# Patient Record
Sex: Male | Born: 1955 | Race: White | Hispanic: No | Marital: Married | Smoking: Former smoker
Health system: Southern US, Community
[De-identification: ages and names within clinical notes are randomized; demographics above are authoritative.]

## PROBLEM LIST (undated history)

## (undated) DIAGNOSIS — F32A Depression, unspecified: Secondary | ICD-10-CM

## (undated) DIAGNOSIS — K219 Gastro-esophageal reflux disease without esophagitis: Secondary | ICD-10-CM

## (undated) DIAGNOSIS — K921 Melena: Secondary | ICD-10-CM

## (undated) DIAGNOSIS — R42 Dizziness and giddiness: Secondary | ICD-10-CM

## (undated) DIAGNOSIS — F329 Major depressive disorder, single episode, unspecified: Secondary | ICD-10-CM

## (undated) DIAGNOSIS — Z87442 Personal history of urinary calculi: Secondary | ICD-10-CM

## (undated) DIAGNOSIS — I8390 Asymptomatic varicose veins of unspecified lower extremity: Secondary | ICD-10-CM

## (undated) DIAGNOSIS — B019 Varicella without complication: Secondary | ICD-10-CM

## (undated) DIAGNOSIS — C61 Malignant neoplasm of prostate: Secondary | ICD-10-CM

## (undated) HISTORY — DX: Major depressive disorder, single episode, unspecified: F32.9

## (undated) HISTORY — PX: PROSTATE SURGERY: SHX751

## (undated) HISTORY — DX: Varicella without complication: B01.9

## (undated) HISTORY — PX: OTHER SURGICAL HISTORY: SHX169

## (undated) HISTORY — DX: Gastro-esophageal reflux disease without esophagitis: K21.9

## (undated) HISTORY — DX: Dizziness and giddiness: R42

## (undated) HISTORY — DX: Asymptomatic varicose veins of unspecified lower extremity: I83.90

## (undated) HISTORY — DX: Melena: K92.1

## (undated) HISTORY — PX: VARICOSE VEIN SURGERY: SHX832

## (undated) HISTORY — DX: Depression, unspecified: F32.A

## (undated) HISTORY — DX: Malignant neoplasm of prostate: C61

## (undated) HISTORY — PX: MULTIPLE TOOTH EXTRACTIONS: SHX2053

---

## 2003-11-28 ENCOUNTER — Ambulatory Visit: Payer: Self-pay | Admitting: Surgery

## 2003-11-29 ENCOUNTER — Ambulatory Visit: Payer: Self-pay | Admitting: Surgery

## 2005-07-15 ENCOUNTER — Emergency Department: Payer: Self-pay | Admitting: Emergency Medicine

## 2006-06-10 ENCOUNTER — Ambulatory Visit: Payer: Self-pay | Admitting: Internal Medicine

## 2006-11-30 ENCOUNTER — Ambulatory Visit: Payer: Self-pay | Admitting: Unknown Physician Specialty

## 2012-09-27 ENCOUNTER — Ambulatory Visit: Payer: Self-pay | Admitting: Internal Medicine

## 2013-03-18 ENCOUNTER — Ambulatory Visit: Payer: Self-pay | Admitting: Internal Medicine

## 2013-05-01 ENCOUNTER — Emergency Department: Payer: Self-pay | Admitting: Emergency Medicine

## 2015-02-01 ENCOUNTER — Ambulatory Visit: Payer: PRIVATE HEALTH INSURANCE | Admitting: Family Medicine

## 2015-02-07 ENCOUNTER — Ambulatory Visit: Payer: PRIVATE HEALTH INSURANCE | Admitting: Family Medicine

## 2015-02-09 ENCOUNTER — Encounter (INDEPENDENT_AMBULATORY_CARE_PROVIDER_SITE_OTHER): Payer: Self-pay

## 2015-02-09 ENCOUNTER — Ambulatory Visit (INDEPENDENT_AMBULATORY_CARE_PROVIDER_SITE_OTHER): Payer: PRIVATE HEALTH INSURANCE | Admitting: Family Medicine

## 2015-02-09 ENCOUNTER — Encounter: Payer: Self-pay | Admitting: Family Medicine

## 2015-02-09 VITALS — BP 116/70 | HR 59 | Temp 98.0°F | Ht 66.0 in | Wt 196.4 lb

## 2015-02-09 DIAGNOSIS — R079 Chest pain, unspecified: Secondary | ICD-10-CM | POA: Diagnosis not present

## 2015-02-09 DIAGNOSIS — R42 Dizziness and giddiness: Secondary | ICD-10-CM

## 2015-02-09 NOTE — Patient Instructions (Signed)
Nice to meet you. We will have you return for lab work. If you develop chest pain, shortness of breath, persistent dizziness, numbness, weakness, vision changes, palpitations, or any new or changing symptoms please seek medical attention.

## 2015-02-13 ENCOUNTER — Encounter: Payer: Self-pay | Admitting: Family Medicine

## 2015-02-13 DIAGNOSIS — R079 Chest pain, unspecified: Secondary | ICD-10-CM | POA: Insufficient documentation

## 2015-02-13 DIAGNOSIS — R42 Dizziness and giddiness: Secondary | ICD-10-CM | POA: Insufficient documentation

## 2015-02-13 NOTE — Assessment & Plan Note (Signed)
Chest pain atypical in nature. Reassuring EKG. Could be musculoskeletal. Could be cardiac. Doubt pulmonary cause given normal lung exam and stable oxygenation. Doubt VTE given stable vitals. Discussed cardiology evaluation given his family history, though he declined this. We will obtain screening lab work. He would like to continue to monitor this at this time. Given return precautions.

## 2015-02-13 NOTE — Progress Notes (Signed)
Patient ID: Brian Luna, male   DOB: 02-24-1955, 60 y.o.   MRN: [id number]  Brian Rumps, MD Phone: [phone]  Brian Luna is a 60 y.o. male who presents today for new patient visit.  Vertigo: Patient notes long history of vertigo. States he intermittently has a sensation of feeling off balance. Denies lightheadedness, chest pain, and palpitations with this. Typically lasts a couple days at a time. Has previously been evaluated by ENT for this and advised that it was vertigo. He takes Antivert and this helps with his symptoms. Denies numbness, weakness, and vision changes. No headaches.  Chest pain: Patient notes 2 weekends ago he had a light discomfort in the center of his chest. 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 No shortness of breath, diaphoresis, or radiation. No lower extremity edema. No fevers. States it lasted for about 24 hours. Resolved on its own. Last time he had symptoms like this was several years ago. No personal history of cardiac issues. Father had a heart attack at 20. Grandfather had a heart attack at 46. No recurrence of symptoms since that time. No history of DVT. No history of PE. No recent surgeries. No recent trips. Reports he previously saw a cardiologist for chest pain. Reports having had a stress test in the past.  Active Ambulatory Problems    Diagnosis Date Noted  . Chest pain 02/13/2015  . Vertigo 02/13/2015   Resolved Ambulatory Problems    Diagnosis Date Noted  . No Resolved Ambulatory Problems   Past Medical History  Diagnosis Date  . Blood in stool   . Prostate cancer ([place])   . Chickenpox   . Depression   . GERD (gastroesophageal reflux disease)   . Varicose veins     Family History  Problem Relation Age of Onset  . Arthritis    . Heart attack Father 42  . Heart attack Paternal Grandfather 62    Social History   Social History  . Marital Status: Married    Spouse Name: N/A  . Number of Children: N/A  .  Years of Education: N/A   Occupational History  . Not on file.   Social History Main Topics  . Smoking status: Former Research scientist (life sciences)  . Smokeless tobacco: Not on file  . Alcohol Use: 0.0 oz/week    0 Standard drinks or equivalent per week     Comment: Occasional  . Drug Use: No  . Sexual Activity: Not on file   Other Topics Concern  . Not on file   Social History Narrative    ROS   General:  Negative for nexplained weight loss, fever Skin: Negative for new or changing mole, sore that won't heal HEENT: Positive for trouble hearing, ringing in his ears, Negative for trouble seeing, mouth sores, hoarseness, change in voice, dysphagia. CV:  Positive for chest pain, Negative for dyspnea, edema, palpitations Resp: Negative for cough, dyspnea, hemoptysis GI: Negative for nausea, vomiting, diarrhea, constipation, abdominal pain, melena, hematochezia. GU: Positive for urinary incontinence and frequent urination and sexual difficulty (all following prostate surgery, unchanged recently), Negative for dysuria, urinary hesitance, hematuria, vaginal or penile discharge, polyuria, lumps in testicle or breasts MSK: Negative for muscle cramps or aches, joint pain or swelling Neuro: Positive for dizziness, Negative for headaches, weakness, numbness, passing out/fainting Psych: Negative for depression, anxiety, memory problems  Objective  Physical Exam Filed Vitals:   02/09/15 1507  BP: 116/70  Pulse: 59  Temp: 98 F (36.7 C)  BP Readings from Last 3 Encounters:  02/09/15 116/70   Wt Readings from Last 3 Encounters:  02/09/15 196 lb 6.4 oz (89.086 kg)    Physical Exam  Constitutional: He is well-developed, well-nourished, and in no distress.  HENT:  Head: Normocephalic and atraumatic.  Right Ear: External ear normal.  Left Ear: External ear normal.  Mouth/Throat: Oropharynx is clear and moist. No oropharyngeal exudate.  Eyes: Conjunctivae are normal. Pupils are equal, round, and  reactive to light.  Neck: Neck supple.  Cardiovascular: Normal rate, regular rhythm and normal heart sounds.  Exam reveals no gallop and no friction rub.   No murmur heard. Pulmonary/Chest: Effort normal and breath sounds normal. No respiratory distress. He has no wheezes. He has no rales.  Abdominal: Soft. Bowel sounds are normal. He exhibits no distension. There is no tenderness. There is no rebound and no guarding.  Musculoskeletal: He exhibits no edema.  Lymphadenopathy:    He has no cervical adenopathy.  Neurological: He is alert.  CN 2-12 intact, 5/5 strength in bilateral biceps, triceps, grip, quads, hamstrings, plantar and dorsiflexion, sensation to light touch intact in bilateral UE and LE, normal gait, 2+ patellar reflexes  Skin: Skin is warm and dry. He is not diaphoretic.  Psychiatric: Mood and affect normal.   EKG: Sinus bradycardia, rate 55, early repolarization variant and lateral leads, no ischemic changes  Assessment/Plan:   Chest pain Chest pain atypical in nature. Reassuring EKG. Could be musculoskeletal. Could be cardiac. Doubt pulmonary cause given normal lung exam and stable oxygenation. Doubt VTE given stable vitals. Discussed cardiology evaluation given his family history, though he declined this. We will obtain screening lab work. He would like to continue to monitor this at this time. Given return precautions.  Vertigo Patient presents with intermittent episodes of vertigo. Neurologically intact. Previously evaluated by ENT. Well treated with as needed meclizine. We will request records from ENT. She will continue to monitor. Given return precautions.    Orders Placed This Encounter  Procedures  . Lipid Profile    Standing Status: Future     Number of Occurrences:      Standing Expiration Date: 02/09/2016  . Comp Met (CMET)    Standing Status: Future     Number of Occurrences:      Standing Expiration Date: 02/09/2016  . HgB A1c    Standing Status: Future      Number of Occurrences:      Standing Expiration Date: 02/09/2016  . TSH    Standing Status: Future     Number of Occurrences:      Standing Expiration Date: 02/09/2016  . CBC    Standing Status: Future     Number of Occurrences:      Standing Expiration Date: 02/09/2016  . EKG 12-Lead    Brian Luna

## 2015-02-13 NOTE — Assessment & Plan Note (Signed)
Patient presents with intermittent episodes of vertigo. Neurologically intact. Previously evaluated by ENT. Well treated with as needed meclizine. We will request records from ENT. She will continue to monitor. Given return precautions.

## 2015-03-09 ENCOUNTER — Other Ambulatory Visit (INDEPENDENT_AMBULATORY_CARE_PROVIDER_SITE_OTHER): Payer: PRIVATE HEALTH INSURANCE

## 2015-03-09 DIAGNOSIS — R079 Chest pain, unspecified: Secondary | ICD-10-CM | POA: Diagnosis not present

## 2015-03-09 LAB — LIPID PANEL
CHOLESTEROL: 188 mg/dL (ref 0–200)
HDL: 48.9 mg/dL (ref >39.00)
LDL Cholesterol: 124 mg/dL — ABNORMAL HIGH (ref 0–99)
NonHDL: 138.83
TRIGLYCERIDES: 74 mg/dL (ref 0.0–149.0)
Total CHOL/HDL Ratio: 4
VLDL: 14.8 mg/dL (ref 0.0–40.0)

## 2015-03-09 LAB — COMPREHENSIVE METABOLIC PANEL
ALBUMIN: 4.5 g/dL (ref 3.5–5.2)
ALK PHOS: 66 U/L (ref 39–117)
ALT: 20 U/L (ref 0–53)
AST: 22 U/L (ref 0–37)
BILIRUBIN TOTAL: 0.8 mg/dL (ref 0.2–1.2)
BUN: 15 mg/dL (ref 6–23)
CALCIUM: 9.6 mg/dL (ref 8.4–10.5)
CO2: 27 mEq/L (ref 19–32)
Chloride: 107 mEq/L (ref 96–112)
Creatinine, Ser: 1.02 mg/dL (ref 0.40–1.50)
GFR: 79.57 mL/min (ref >60.00)
GLUCOSE: 93 mg/dL (ref 70–99)
Potassium: 4.8 mEq/L (ref 3.5–5.1)
Sodium: 142 mEq/L (ref 135–145)
TOTAL PROTEIN: 6.8 g/dL (ref 6.0–8.3)

## 2015-03-09 LAB — CBC
HEMATOCRIT: 43.1 % (ref 39.0–52.0)
Hemoglobin: 14.7 g/dL (ref 13.0–17.0)
MCHC: 34.1 g/dL (ref 30.0–36.0)
MCV: 91 fl (ref 78.0–100.0)
Platelets: 225 10*3/uL (ref 150.0–400.0)
RBC: 4.73 Mil/uL (ref 4.22–5.81)
RDW: 14.2 % (ref 11.5–15.5)
WBC: 8 10*3/uL (ref 4.0–10.5)

## 2015-03-09 LAB — HEMOGLOBIN A1C: HEMOGLOBIN A1C: 5.1 % (ref 4.6–6.5)

## 2015-03-09 LAB — TSH: TSH: 0.72 u[IU]/mL (ref 0.35–4.50)

## 2015-03-19 ENCOUNTER — Ambulatory Visit (INDEPENDENT_AMBULATORY_CARE_PROVIDER_SITE_OTHER): Payer: PRIVATE HEALTH INSURANCE | Admitting: Family Medicine

## 2015-03-19 ENCOUNTER — Encounter: Payer: Self-pay | Admitting: Family Medicine

## 2015-03-19 VITALS — BP 124/80 | HR 65 | Temp 98.1°F | Ht 66.0 in | Wt 198.2 lb

## 2015-03-19 DIAGNOSIS — R42 Dizziness and giddiness: Secondary | ICD-10-CM

## 2015-03-19 DIAGNOSIS — E7800 Pure hypercholesterolemia, unspecified: Secondary | ICD-10-CM | POA: Insufficient documentation

## 2015-03-19 DIAGNOSIS — R079 Chest pain, unspecified: Secondary | ICD-10-CM

## 2015-03-19 NOTE — Assessment & Plan Note (Addendum)
No episodes since his last visit. Neurologically intact. We'll continue to monitor. Given return precautions.

## 2015-03-19 NOTE — Assessment & Plan Note (Signed)
No recurrence. We'll continue to monitor. If recurs will need to see cardiology. Given return precautions.

## 2015-03-19 NOTE — Progress Notes (Signed)
Patient ID: Brian Luna, male   DOB: 12/27/1955, 60 y.o.   MRN: [id number]  Tommi Rumps, MD Phone: [phone]  Brian Luna is a 60 y.o. male who presents today for follow-up.  Hyperlipidemia: Patient recently diagnosed with elevated LDL to 126. His ASCVD risk score placed him in the possible benefit group for statin. Patient opted for diet and exercise to treat this. He notes he "eats the good stuff." He reports he is quite active through work. Does not do specific exercise.  Chest pain: He reports no chest pain or shortness of breath since his last visit. He notes no fatigue. He notes he quit smoking several years ago.  Vertigo: No recurrence of his vertigo. No lightheadedness or dizziness since her last office visit. Has not needed Antivert.  PMH: Former smoker   ROS see history of present illness  Objective  Physical Exam Filed Vitals:   03/19/15 1550  BP: 124/80  Pulse: 65  Temp: 98.1 F (36.7 C)    BP Readings from Last 3 Encounters:  03/19/15 124/80  02/09/15 116/70   Wt Readings from Last 3 Encounters:  03/19/15 198 lb 3.2 oz (89.903 kg)  02/09/15 196 lb 6.4 oz (89.086 kg)    Physical Exam  Constitutional: He is well-developed, well-nourished, and in no distress.  HENT:  Head: Normocephalic and atraumatic.  Right Ear: External ear normal.  Left Ear: External ear normal.  Mouth/Throat: Oropharynx is clear and moist. No oropharyngeal exudate.  Eyes: Conjunctivae are normal. Pupils are equal, round, and reactive to light.  Cardiovascular: Normal rate, regular rhythm and normal heart sounds.  Exam reveals no gallop and no friction rub.   No murmur heard. Pulmonary/Chest: Effort normal and breath sounds normal. No respiratory distress. He has no wheezes. He has no rales.  Neurological: He is alert. Gait normal.  Skin: Skin is warm and dry. He is not diaphoretic.     Assessment/Plan: Please see individual problem list.  Chest pain No recurrence. We'll  continue to monitor. If recurs will need to see cardiology. Given return precautions.  Vertigo No episodes since his last visit. Neurologically intact. We'll continue to monitor. Given return precautions.  Elevated LDL cholesterol level LDL mildly elevated 126. ASCVD risk score of 6% places him in possible statin benefit group. Discussed options of treatment with a statin versus diet and exercise. Patient opted for diet and exercise. He was given dietary information. Discussed diet and exercise as well. He will work on this and we will have him back in 2 months to recheck his LDL. Given return precautions.     Tommi Rumps, MD [place]

## 2015-03-19 NOTE — Patient Instructions (Signed)
Nice to see you. Please work on [employer] as outlined in the dietary instructions. Please start exercises well. If you develop chest pain, shortness of breath, dizziness, or any new or changing symptoms please seek medical attention.

## 2015-03-19 NOTE — Assessment & Plan Note (Signed)
LDL mildly elevated 126. ASCVD risk score of 6% places him in possible statin benefit group. Discussed options of treatment with a statin versus diet and exercise. Patient opted for diet and exercise. He was given dietary information. Discussed diet and exercise as well. He will work on this and we will have him back in 2 months to recheck his LDL. Given return precautions.

## 2015-03-19 NOTE — Progress Notes (Signed)
Pre visit review using our clinic review tool, if applicable. No additional management support is needed unless otherwise documented below in the visit note. 

## 2015-05-23 ENCOUNTER — Ambulatory Visit: Payer: PRIVATE HEALTH INSURANCE | Admitting: Family Medicine

## 2015-06-01 ENCOUNTER — Encounter: Payer: Self-pay | Admitting: Family Medicine

## 2015-06-01 ENCOUNTER — Ambulatory Visit (INDEPENDENT_AMBULATORY_CARE_PROVIDER_SITE_OTHER): Payer: PRIVATE HEALTH INSURANCE | Admitting: Family Medicine

## 2015-06-01 VITALS — BP 122/74 | HR 65 | Temp 98.4°F | Ht 66.0 in | Wt 195.2 lb

## 2015-06-01 DIAGNOSIS — K219 Gastro-esophageal reflux disease without esophagitis: Secondary | ICD-10-CM

## 2015-06-01 DIAGNOSIS — E7800 Pure hypercholesterolemia, unspecified: Secondary | ICD-10-CM

## 2015-06-01 DIAGNOSIS — E669 Obesity, unspecified: Secondary | ICD-10-CM

## 2015-06-01 NOTE — Patient Instructions (Signed)
Nice to see you. Please work on diet and exercise up to lose weight which will help with her reflux. If your reflux continues to be an issue despite your efforts to lose weight you should call us so we can get you set up with GI. If you develop abdominal pain, blood in her stool, worsening reflux, or any new or changing symptoms please seek medical attention.

## 2015-06-01 NOTE — Assessment & Plan Note (Signed)
Patient with relatively well controlled symptoms on Prilosec and Zantac. Discussed that persistent use of Prilosec and other PPIs have been proven to have fairly significant side effects long-term. Discussed option of seeing GI to have an EGD given his persistent symptoms. He declined this opting to work on weight loss. He'll continue his current Prilosec and Zantac regimen and work on weight loss. If his symptoms do not improve he will contact us so we can get him referred to GI. Given return precautions.

## 2015-06-01 NOTE — Progress Notes (Signed)
Pre visit review using our clinic review tool, if applicable. No additional management support is needed unless otherwise documented below in the visit note. 

## 2015-06-01 NOTE — Assessment & Plan Note (Signed)
BMI greater than 30 though slightly improved from previously. He will start work on diet and exercise.

## 2015-06-01 NOTE — Assessment & Plan Note (Signed)
Has not worked on diet and exercise. Discussed diet at length. Encouraged exercise though he does work a fairly physically active job. He'll start with diet changes several days a week.

## 2015-06-01 NOTE — Progress Notes (Signed)
Patient ID: Brian Luna, male   DOB: 10/08/1955, 60 y.o.   MRN: [id number]  Tommi Rumps, MD Phone: [phone]  Brian Luna is a 60 y.o. male who presents today for follow-up.  GERD: Patient notes long history of reflux. Takes Prilosec once a day and Zantac once a day. Notes only gets heartburn depending on what he eats. Particularly with acidic foods. Notes in the past he has lost a lot of weight and his reflux was improved. No abdominal pain. No blood in stool.  Elevated LDL: Patient really has not worked on diet since her last visit. He is physically active with work but does not do any exercise on top of this. No chest pain or shortness of breath.  Obesity: BMI slightly improved from previously. Hasn't really made any changes to his diet and exercise. He plans to quit buying Pringles. He notes breakfast consists of a package of mini muffins. Lunch is a sandwich and chips. Dinner consists of chips. Does eat ice cream every day.  PMH: Former smoker  ROS see history of present illness  Objective  Physical Exam Filed Vitals:   06/01/15 1503  BP: 122/74  Pulse: 65  Temp: 98.4 F (36.9 C)    BP Readings from Last 3 Encounters:  06/01/15 122/74  03/19/15 124/80  02/09/15 116/70   Wt Readings from Last 3 Encounters:  06/01/15 195 lb 3.2 oz (88.542 kg)  03/19/15 198 lb 3.2 oz (89.903 kg)  02/09/15 196 lb 6.4 oz (89.086 kg)    Physical Exam  Constitutional: He is well-developed, well-nourished, and in no distress.  HENT:  Head: Normocephalic and atraumatic.  Right Ear: External ear normal.  Left Ear: External ear normal.  Cardiovascular: Normal rate, regular rhythm and normal heart sounds.   Pulmonary/Chest: Effort normal and breath sounds normal. No respiratory distress. He has no wheezes. He has no rales.  Abdominal: Soft. Bowel sounds are normal. He exhibits no distension. There is no tenderness. There is no rebound and no guarding.  Neurological: He is alert. Gait  normal.  Skin: Skin is warm and dry. He is not diaphoretic.     Assessment/Plan: Please see individual problem list.  Elevated LDL cholesterol level Has not worked on diet and exercise. Discussed diet at length. Encouraged exercise though he does work a fairly physically active job. He'll start with diet changes several days a week.  GERD (gastroesophageal reflux disease) Patient with relatively well controlled symptoms on Prilosec and Zantac. Discussed that persistent use of Prilosec and other PPIs have been proven to have fairly significant side effects long-term. Discussed option of seeing GI to have an EGD given his persistent symptoms. He declined this opting to work on weight loss. He'll continue his current Prilosec and Zantac regimen and work on weight loss. If his symptoms do not improve he will contact us so we can get him referred to GI. Given return precautions.  Obesity BMI greater than 30 though slightly improved from previously. He will start work on diet and exercise.    Tommi Rumps, MD [place]

## 2016-01-17 DIAGNOSIS — R300 Dysuria: Secondary | ICD-10-CM | POA: Diagnosis not present

## 2016-01-17 DIAGNOSIS — R3914 Feeling of incomplete bladder emptying: Secondary | ICD-10-CM | POA: Diagnosis not present

## 2016-01-17 DIAGNOSIS — R3915 Urgency of urination: Secondary | ICD-10-CM | POA: Diagnosis not present

## 2016-01-17 DIAGNOSIS — R350 Frequency of micturition: Secondary | ICD-10-CM | POA: Diagnosis not present

## 2016-03-31 DIAGNOSIS — R350 Frequency of micturition: Secondary | ICD-10-CM | POA: Diagnosis not present

## 2016-03-31 DIAGNOSIS — R3915 Urgency of urination: Secondary | ICD-10-CM | POA: Diagnosis not present

## 2016-03-31 DIAGNOSIS — C61 Malignant neoplasm of prostate: Secondary | ICD-10-CM | POA: Diagnosis not present

## 2016-03-31 DIAGNOSIS — D400 Neoplasm of uncertain behavior of prostate: Secondary | ICD-10-CM | POA: Diagnosis not present

## 2016-06-12 DIAGNOSIS — M41112 Juvenile idiopathic scoliosis, cervical region: Secondary | ICD-10-CM | POA: Diagnosis not present

## 2016-06-12 DIAGNOSIS — D400 Neoplasm of uncertain behavior of prostate: Secondary | ICD-10-CM | POA: Diagnosis not present

## 2016-06-12 DIAGNOSIS — R3914 Feeling of incomplete bladder emptying: Secondary | ICD-10-CM | POA: Diagnosis not present

## 2016-06-12 DIAGNOSIS — N411 Chronic prostatitis: Secondary | ICD-10-CM | POA: Diagnosis not present

## 2016-06-12 DIAGNOSIS — R102 Pelvic and perineal pain: Secondary | ICD-10-CM | POA: Diagnosis not present

## 2016-06-18 DIAGNOSIS — N210 Calculus in bladder: Secondary | ICD-10-CM | POA: Diagnosis not present

## 2016-06-19 ENCOUNTER — Encounter
Admission: RE | Admit: 2016-06-19 | Discharge: 2016-06-19 | Disposition: A | Discharge status: Home / Self Care | Payer: PRIVATE HEALTH INSURANCE | Source: Ambulatory Visit | Attending: Urology | Admitting: Urology

## 2016-06-19 HISTORY — DX: Personal history of urinary calculi: Z87.442

## 2016-06-19 NOTE — H&P (Signed)
NAME:  Brian Luna, Brian Luna                      ACCOUNT NO.:  MEDICAL RECORD NO.:  [id number]  LOCATION:                                 FACILITY:  PHYSICIAN:  Maryan Puls          DATE OF BIRTH:  08/09/1956  DATE OF ADMISSION: DATE OF DISCHARGE:                            HISTORY AND PHYSICAL   Same-day surgery is scheduled for June 24, 2016.  CHIEF COMPLAINT:  Difficulty voiding.  HISTORY OF PRESENT ILLNESS:  Brian Luna is a 61 year old white male, who presented to the office in early June with perineal discomfort and increasing lower urinary tract voiding symptoms.  At this time, he complains of incomplete emptying, frequency, intermittency, urgency, weak stream, straining, and nocturia.  Urine culture was negative. Cystoscopy on June 18, 2016, revealed that he had a 6 x 8 mm spiculated urinary calculus lodged in the prostatic urethra.  He comes in now for laser lithotripsy of that calculus.  ALLERGIES:  NO DRUG ALLERGIES.  CURRENT MEDICATIONS:  Include aspirin, Aleve, Allegra, Prilosec, and Zantac.  SURGICAL HISTORY: 1. Vasectomy in [place]. 3. Left leg varicose vein removal in 2004. 4. HIFU 2014.  SOCIAL HISTORY:  The patient smokes a pack a day, has a 40 pack-year history.  Denies alcohol use.  FAMILY HISTORY:  The patient has an uncle with prostate cancer and father with kidney stones.  Father also has hypertension and GERD.  PAST AND CURRENT MEDICAL CONDITIONS: 1. 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 2. GERD. 3. Arthritis. 4. Allergic rhinitis. 5. Varicose vein. 6. Erectile dysfunction.  REVIEW OF SYSTEMS:  The patient denies chest pain, shortness of breath, diabetes, stroke, heart disease, or hypertension.  PHYSICAL EXAMINATION:  GENERAL:  Well-nourished white male in no acute distress. HEENT:  Sclerae were clear.  Pupils were equally round, reactive to light  and accommodation.  Extraocular movements are intact. NECK:  Supple.  No palpable cervical adenopathy.  Thyroid gland was smooth and nontender without nodules.  No audible carotid bruits. LUNGS:  Clear to auscultation.  CARDIOVASCULAR:  Regular rhythm and rate without audible murmurs or gallops. ABDOMEN:  Soft, nontender abdomen. GU:  Circumcised testes, smooth, nontender, 20 mL size each. RECTAL:  Prostate gland was absent.  No palpable rectal masses. NEUROMUSCULAR:  Nonfocal.  IMPRESSION:  Prostatic urethral calculus.  PLAN:  Cystoscopy with holmium laser lithotripsy of the calculus.          ______________________________ Maryan Puls     MW/MEDQ  D:  06/19/2016  T:  06/19/2016  Job:  [id number]

## 2016-06-19 NOTE — Patient Instructions (Signed)
  Your procedure is scheduled on: 06/24/16 Report to Same Day Surgery 2nd floor medical mall [facility] Entrance-take elevator on left to 2nd floor.  Check in with surgery information desk.) To find out your arrival time please call [phone] between 1PM - 3PM on  06/23/16  Remember: Instructions that are not followed completely may result in serious medical risk, up to and including death, or upon the discretion of your surgeon and anesthesiologist your surgery may need to be rescheduled.    _x___ 1. Do not eat food or drink liquids after midnight. No gum chewing or                              hard candies.     __x__ 2. No Alcohol for 24 hours before or after surgery.   __x__3. No Smoking for 24 prior to surgery.   ____  4. Bring all medications with you on the day of surgery if instructed.    __x__ 5. Notify your doctor if there is any change in your medical condition     (cold, fever, infections).     Do not wear jewelry, make-up, hairpins, clips or nail polish.  Do not wear lotions, powders, or perfumes. You may wear deodorant.  Do not shave 48 hours prior to surgery. Men may shave face and neck.  Do not bring valuables to the hospital.    [facility] is not responsible for any belongings or valuables.               Contacts, dentures or bridgework may not be worn into surgery.  Leave your suitcase in the car. After surgery it may be brought to your room.  For patients admitted to the hospital, discharge time is determined by your                       treatment team.   Patients discharged the day of surgery will not be allowed to drive home.  You will need someone to drive you home and stay with you the night of your procedure.     _x___ Take anti-hypertensive (unless it includes a diuretic), cardiac, seizure, asthma,     anti-reflux and psychiatric medicines. These include:  1. RANITIDINE OR NEXIUM   2. MAGNESIUM  3. TAMSULOSIN  4.  5.  6.  ____Fleets enema or  Magnesium Citrate as directed.   ____ Use CHG Soap or sage wipes as directed on instruction sheet   ____ Use inhalers on the day of surgery and bring to hospital day of surgery  ____ Stop Metformin and Janumet 2 days prior to surgery.    ____ Take 1/2 of usual insulin dose the night before surgery and none on the morning     surgery.   ___ Follow recommendations from Cardiologist, Pulmonologist or PCP regarding   stopping Aspirin, Coumadin, Pllavix ,Eliquis, Effient, or Pradaxa, and Pletal. ALREADY STOPPED ASPIRIN X____Stop Anti-inflammatories such as Advil, Aleve, Ibuprofen, Motrin, Naproxen, Naprosyn, Goodies powders or aspirin products. OK to take Tylenol and   Celebrex. STOP NAPROXEN  ____ Stop supplements until after surgery.  But may continue Vitamin D, Vitamin B  and multivitamin.   ____ Bring C-Pap to the hospital.

## 2016-06-23 MED ORDER — CEFAZOLIN SODIUM-DEXTROSE 1-4 GM/50ML-% IV SOLN
1.0000 g | Freq: Once | INTRAVENOUS | Status: AC
  Administered 2016-06-24: 1 g via INTRAVENOUS

## 2016-06-24 ENCOUNTER — Encounter
Admission: RE | Disposition: A | Discharge status: Home / Self Care | Payer: Self-pay | Source: Ambulatory Visit | Attending: Urology

## 2016-06-24 ENCOUNTER — Ambulatory Visit
Admission: RE | Admit: 2016-06-24 | Discharge: 2016-06-24 | Disposition: A | Discharge status: Home / Self Care | Payer: BLUE CROSS/BLUE SHIELD | Source: Ambulatory Visit | Attending: Urology | Admitting: Urology

## 2016-06-24 ENCOUNTER — Ambulatory Visit: Payer: BLUE CROSS/BLUE SHIELD | Admitting: Anesthesiology

## 2016-06-24 DIAGNOSIS — N201 Calculus of ureter: Secondary | ICD-10-CM | POA: Diagnosis not present

## 2016-06-24 DIAGNOSIS — N320 Bladder-neck obstruction: Secondary | ICD-10-CM

## 2016-06-24 DIAGNOSIS — Z87891 Personal history of nicotine dependence: Secondary | ICD-10-CM | POA: Diagnosis not present

## 2016-06-24 DIAGNOSIS — K219 Gastro-esophageal reflux disease without esophagitis: Secondary | ICD-10-CM | POA: Insufficient documentation

## 2016-06-24 DIAGNOSIS — N210 Calculus in bladder: Secondary | ICD-10-CM | POA: Diagnosis not present

## 2016-06-24 DIAGNOSIS — N211 Calculus in urethra: Secondary | ICD-10-CM | POA: Insufficient documentation

## 2016-06-24 DIAGNOSIS — F329 Major depressive disorder, single episode, unspecified: Secondary | ICD-10-CM | POA: Insufficient documentation

## 2016-06-24 HISTORY — PX: CYSTOSCOPY WITH LITHOLAPAXY: SHX1425

## 2016-06-24 HISTORY — PX: HOLMIUM LASER APPLICATION: SHX5852

## 2016-06-24 SURGERY — CYSTOSCOPY, WITH BLADDER CALCULUS LITHOLAPAXY
Anesthesia: General

## 2016-06-24 MED ORDER — URIBEL 118 MG PO CAPS: 1.0000 | ORAL_CAPSULE | Freq: Four times a day (QID) | ORAL | 3 refills | Status: DC | PRN

## 2016-06-24 MED ORDER — PROPOFOL 10 MG/ML IV BOLUS
INTRAVENOUS | Status: DC | PRN
  Administered 2016-06-24: 160 mg via INTRAVENOUS

## 2016-06-24 MED ORDER — LIDOCAINE HCL 2 % EX GEL
CUTANEOUS | Status: DC | PRN
  Administered 2016-06-24: 1 via URETHRAL

## 2016-06-24 MED ORDER — PHENYLEPHRINE HCL 10 MG/ML IJ SOLN
INTRAMUSCULAR | Status: DC | PRN
  Administered 2016-06-24 (×2): 50 ug via INTRAVENOUS

## 2016-06-24 MED ORDER — MIDAZOLAM HCL 2 MG/2ML IJ SOLN
INTRAMUSCULAR | Status: AC
  Filled 2016-06-24: qty 2

## 2016-06-24 MED ORDER — DIPHENHYDRAMINE HCL 50 MG/ML IJ SOLN
INTRAMUSCULAR | Status: AC
  Filled 2016-06-24: qty 1

## 2016-06-24 MED ORDER — ACETAMINOPHEN-CODEINE #3 300-30 MG PO TABS: 1.0000 | ORAL_TABLET | ORAL | 0 refills | Status: DC | PRN

## 2016-06-24 MED ORDER — FENTANYL CITRATE (PF) 100 MCG/2ML IJ SOLN
INTRAMUSCULAR | Status: AC
  Filled 2016-06-24: qty 2

## 2016-06-24 MED ORDER — HYOSCYAMINE SULFATE SL 0.125 MG SL SUBL: 0.1250 mg | SUBLINGUAL_TABLET | SUBLINGUAL | 1 refills | Status: DC | PRN

## 2016-06-24 MED ORDER — MIDAZOLAM HCL 5 MG/5ML IJ SOLN
INTRAMUSCULAR | Status: DC | PRN
  Administered 2016-06-24: 2 mg via INTRAVENOUS

## 2016-06-24 MED ORDER — LACTATED RINGERS IV SOLN
INTRAVENOUS | Status: DC
  Administered 2016-06-24: 12:00:00 via INTRAVENOUS

## 2016-06-24 MED ORDER — BELLADONNA ALKALOIDS-OPIUM 16.2-60 MG RE SUPP
RECTAL | Status: DC | PRN
  Administered 2016-06-24: 1 via RECTAL

## 2016-06-24 MED ORDER — PROPOFOL 10 MG/ML IV BOLUS
INTRAVENOUS | Status: AC
  Filled 2016-06-24: qty 20

## 2016-06-24 MED ORDER — ONDANSETRON HCL 4 MG/2ML IJ SOLN
INTRAMUSCULAR | Status: DC | PRN
  Administered 2016-06-24: 4 mg via INTRAVENOUS

## 2016-06-24 MED ORDER — DIPHENHYDRAMINE HCL 50 MG/ML IJ SOLN
INTRAMUSCULAR | Status: DC | PRN
Start: 2016-06-24 — End: 2016-06-24
  Administered 2016-06-24: 25 mg via INTRAVENOUS

## 2016-06-24 MED ORDER — ONDANSETRON HCL 4 MG/2ML IJ SOLN: 4.0000 mg | Freq: Once | INTRAMUSCULAR | Status: DC | PRN

## 2016-06-24 MED ORDER — FENTANYL CITRATE (PF) 100 MCG/2ML IJ SOLN
INTRAMUSCULAR | Status: DC | PRN
  Administered 2016-06-24: 50 ug via INTRAVENOUS
  Administered 2016-06-24 (×2): 25 ug via INTRAVENOUS

## 2016-06-24 MED ORDER — KETOROLAC TROMETHAMINE 30 MG/ML IJ SOLN
INTRAMUSCULAR | Status: AC
  Filled 2016-06-24: qty 1

## 2016-06-24 MED ORDER — DEXAMETHASONE SODIUM PHOSPHATE 10 MG/ML IJ SOLN
INTRAMUSCULAR | Status: AC
Start: 2016-06-24 — End: 2016-06-24
  Filled 2016-06-24: qty 1

## 2016-06-24 MED ORDER — ONDANSETRON HCL 4 MG/2ML IJ SOLN
INTRAMUSCULAR | Status: AC
  Filled 2016-06-24: qty 2

## 2016-06-24 MED ORDER — FENTANYL CITRATE (PF) 100 MCG/2ML IJ SOLN: 25.0000 ug | INTRAMUSCULAR | Status: DC | PRN

## 2016-06-24 MED ORDER — KETOROLAC TROMETHAMINE 30 MG/ML IJ SOLN
INTRAMUSCULAR | Status: DC | PRN
  Administered 2016-06-24: 30 mg via INTRAVENOUS

## 2016-06-24 MED ORDER — CEFAZOLIN SODIUM-DEXTROSE 1-4 GM/50ML-% IV SOLN
INTRAVENOUS | Status: AC
  Filled 2016-06-24: qty 50

## 2016-06-24 MED ORDER — DEXAMETHASONE SODIUM PHOSPHATE 10 MG/ML IJ SOLN
INTRAMUSCULAR | Status: DC | PRN
  Administered 2016-06-24: 5 mg via INTRAVENOUS

## 2016-06-24 SURGICAL SUPPLY — 22 items
BAG DRAIN CYSTO-URO LG1000N (MISCELLANEOUS) ×3 IMPLANT
CATH FOLEY 2WAY SIL 16X30 (CATHETERS) ×3 IMPLANT
CNTNR SPEC 2.5X3XGRAD LEK (MISCELLANEOUS) ×1
CONT SPEC 4OZ STER OR WHT (MISCELLANEOUS) ×2
CONTAINER SPEC 2.5X3XGRAD LEK (MISCELLANEOUS) ×1 IMPLANT
FEE TECHNICIAN ONLY PER HOUR (MISCELLANEOUS) ×3 IMPLANT
FIBER LASER 550 (Laser) ×3 IMPLANT
GLOVE BIO SURGEON STRL SZ7 (GLOVE) ×6 IMPLANT
GLOVE BIO SURGEON STRL SZ7.5 (GLOVE) ×3 IMPLANT
GOWN STRL REUS W/ TWL LRG LVL4 (GOWN DISPOSABLE) ×1 IMPLANT
GOWN STRL REUS W/ TWL XL LVL3 (GOWN DISPOSABLE) ×1 IMPLANT
GOWN STRL REUS W/TWL LRG LVL4 (GOWN DISPOSABLE) ×2
GOWN STRL REUS W/TWL XL LVL3 (GOWN DISPOSABLE) ×2
KIT RM TURNOVER CYSTO AR (KITS) ×3 IMPLANT
PACK CYSTO AR (MISCELLANEOUS) ×3 IMPLANT
PREP PVP WINGED SPONGE (MISCELLANEOUS) ×3 IMPLANT
SET IRRIG Y TYPE TUR BLADDER L (SET/KITS/TRAYS/PACK) ×3 IMPLANT
SOL PREP PVP 2OZ (MISCELLANEOUS) ×3
SOLUTION PREP PVP 2OZ (MISCELLANEOUS) ×1 IMPLANT
SYRINGE IRR TOOMEY STRL 70CC (SYRINGE) IMPLANT
WATER STERILE IRR 1000ML POUR (IV SOLUTION) ×3 IMPLANT
WATER STERILE IRR 3000ML UROMA (IV SOLUTION) IMPLANT

## 2016-06-24 NOTE — H&P (Signed)
Date of Initial H&P: 06/19/16  History reviewed, patient examined, no change in status, stable for surgery.

## 2016-06-24 NOTE — Discharge Instructions (Addendum)
AMBULATORY SURGERY  DISCHARGE INSTRUCTIONS   1) The drugs that you were given will stay in your system until tomorrow so for the next 24 hours you should not:  A) Drive an automobile B) Make any legal decisions C) Drink any alcoholic beverage   2) You may resume regular meals tomorrow.  Today it is better to start with liquids and gradually work up to solid foods.  You may eat anything you prefer, but it is better to start with liquids, then soup and crackers, and gradually work up to solid foods.   3) Please notify your doctor immediately if you have any unusual bleeding, trouble breathing, redness and pain at the surgery site, drainage, fever, or pain not relieved by medication.      Indwelling Urinary Catheter Care, Adult Take good care of your catheter to keep it working and to prevent problems. How to wear your catheter Attach your catheter to your leg with tape (adhesive tape) or a leg strap. Make sure it is not too tight. If you use tape, remove any bits of tape that are already on the catheter. How to wear a drainage bag You should have:  A large overnight bag.  A small leg bag.  Overnight Bag You may wear the overnight bag at any time. Always keep the bag below the level of your bladder but off the floor. When you sleep, put a clean plastic bag in a wastebasket. Then hang the bag inside the wastebasket. Leg Bag Never wear the leg bag at night. Always wear the leg bag below your knee. Keep the leg bag secure with a leg strap or tape. How to care for your skin  Clean the skin around the catheter at least once every day.  Shower every day. Do not take baths.  Put creams, lotions, or ointments on your genital area only as told by your doctor.  Do not use powders, sprays, or lotions on your genital area. How to clean your catheter and your skin 1. Wash your hands with soap and water. 2. Wet a washcloth in warm water and gentle (mild) soap. 3. Use the washcloth to  clean the skin where the catheter enters your body. Clean downward and wipe away from the catheter in small circles. Do not wipe toward the catheter. 4. Pat the area dry with a clean towel. Make sure to clean off all soap. How to care for your drainage bags Empty your drainage bag when it is ?- full or at least 2-3 times a day. Replace your drainage bag once a month or sooner if it starts to smell bad or look dirty. Do not clean your drainage bag unless told by your doctor. Emptying a drainage bag  Supplies Needed  Rubbing alcohol.  Gauze pad or cotton ball.  Tape or a leg strap.  Steps 1. Wash your hands with soap and water. 2. Separate (detach) the bag from your leg. 3. Hold the bag over the toilet or a clean container. Keep the bag below your hips and bladder. This stops pee (urine) from going back into the tube. 4. Open the pour spout at the bottom of the bag. 5. Empty the pee into the toilet or container. Do not let the pour spout touch any surface. 6. Put rubbing alcohol on a gauze pad or cotton ball. 7. Use the gauze pad or cotton ball to clean the pour spout. 8. Close the pour spout. 9. Attach the bag to your leg with tape  or a leg strap. 10. Wash your hands.  Changing a drainage bag Supplies Needed  Alcohol wipes.  A clean drainage bag.  Adhesive tape or a leg strap.  Steps 1. Wash your hands with soap and water. 2. Separate the dirty bag from your leg. 3. Pinch the rubber catheter with your fingers so that pee does not spill out. 4. Separate the catheter tube from the drainage tube where these tubes connect (at the connection valve). Do not let the tubes touch any surface. 5. Clean the end of the catheter tube with an alcohol wipe. Use a different alcohol wipe to clean the end of the drainage tube. 6. Connect the catheter tube to the drainage tube of the clean bag. 7. Attach the new bag to the leg with adhesive tape or a leg strap. 8. Wash your hands.  How to  prevent infection and other problems  Never pull on your catheter or try to remove it. Pulling can damage tissue in your body.  Always wash your hands before and after touching your catheter.  If a leg strap gets wet, replace it with a dry one.  Drink enough fluids to keep your pee clear or pale yellow, or as told by your doctor.  Do not let the drainage bag or tubing touch the floor.  Wear cotton underwear.  If you are male, wipe from front to back after you poop (have a bowel movement).  Check on the catheter often to make sure it works and the tubing is not twisted. Get help if:  Your pee is cloudy.  Your pee smells unusually bad.  Your pee is not draining into the bag.  Your tube gets clogged.  Your catheter starts to leak.  Your bladder feels full. Get help right away if:  You have redness, swelling, or pain where the catheter enters your body.  You have fluid, pus, or a bad smell coming from the area where the catheter enters your body.  The area where the catheter enters your body feels warm.  You have a fever.  You have pain in your: ? Stomach (abdomen). ? Legs. ? Lower back. ? Bladder.  You see blood fill the catheter.  Your pee is pink or red.  You feel sick to your stomach (nauseous).  You throw up (vomit).  You have chills.  Your catheter gets pulled out. This information is not intended to replace advice given to you by your health care provider. Make sure you discuss any questions you have with your health care provider. Document Released: 04/19/2012 Document Revised: 11/21/2015 Document Reviewed: 06/07/2013 Elsevier Interactive Patient Education  [employer].

## 2016-06-24 NOTE — Anesthesia Postprocedure Evaluation (Signed)
Anesthesia Post Note  Patient: Brian Luna  Procedure(s) Performed: Procedure(s) (LRB): CYSTOSCOPY WITH LITHOLAPAXY WITH HOLMIUM LASER (N/A) HOLMIUM LASER APPLICATION (N/A)  Patient location during evaluation: PACU Anesthesia Type: General Level of consciousness: awake and alert Pain management: pain level controlled Vital Signs Assessment: post-procedure vital signs reviewed and stable Respiratory status: spontaneous breathing and respiratory function stable Cardiovascular status: stable Anesthetic complications: no     Last Vitals:  Vitals:   06/24/16 1445 06/24/16 1500  BP: 111/80 108/80  Pulse: 64 65  Resp: 12 (!) 9  Temp:      Last Pain:  Vitals:   06/24/16 1500  TempSrc:   PainSc: 0-No pain                 KEPHART,WILLIAM K

## 2016-06-24 NOTE — Anesthesia Preprocedure Evaluation (Signed)
Anesthesia Evaluation  Patient identified by MRN, date of birth, ID band Patient awake    Reviewed: Allergy & Precautions, NPO status , Patient's Chart, lab work & pertinent test results  History of Anesthesia Complications Negative for: history of anesthetic complications  Airway Mallampati: II       Dental   Pulmonary neg pulmonary ROS, former smoker,           Cardiovascular negative cardio ROS       Neuro/Psych Depression negative neurological ROS     GI/Hepatic Neg liver ROS, GERD  Medicated and Controlled,  Endo/Other  negative endocrine ROS  Renal/GU negative Renal ROS     Musculoskeletal   Abdominal   Peds  Hematology   Anesthesia Other Findings   Reproductive/Obstetrics                             Anesthesia Physical Anesthesia Plan  ASA: II  Anesthesia Plan: General   Post-op Pain Management:    Induction:   PONV Risk Score and Plan: 2 and Ondansetron and Dexamethasone  Airway Management Planned: LMA  Additional Equipment:   Intra-op Plan:   Post-operative Plan:   Informed Consent: I have reviewed the patients History and Physical, chart, labs and discussed the procedure including the risks, benefits and alternatives for the proposed anesthesia with the patient or authorized representative who has indicated his/her understanding and acceptance.     Plan Discussed with:   Anesthesia Plan Comments:         Anesthesia Quick Evaluation

## 2016-06-24 NOTE — Transfer of Care (Signed)
Immediate Anesthesia Transfer of Care Note  Patient: Brian Luna  Procedure(s) Performed: Procedure(s): CYSTOSCOPY WITH LITHOLAPAXY WITH HOLMIUM LASER (N/A) HOLMIUM LASER APPLICATION (N/A)  Patient Location: PACU  Anesthesia Type:General  Level of Consciousness: sedated and responds to stimulation  Airway & Oxygen Therapy: Patient Spontanous Breathing and Patient connected to face mask oxygen  Post-op Assessment: Report given to RN and Post -op Vital signs reviewed and stable  Post vital signs: Reviewed and stable  Last Vitals:  Vitals:   06/24/16 1154 06/24/16 1416  BP: 131/89 104/71  Pulse: 75 78  Resp: 16 11  Temp: (!) 35.9 C 36.9 C    Last Pain:  Vitals:   06/24/16 1154  TempSrc: Tympanic         Complications: No apparent anesthesia complications

## 2016-06-24 NOTE — Anesthesia Procedure Notes (Signed)
Procedure Name: LMA Insertion Date/Time: 06/24/2016 1:20 PM Performed by: Jonna Clark Pre-anesthesia Checklist: Patient identified, Patient being monitored, Timeout performed, Emergency Drugs available and Suction available Patient Re-evaluated:Patient Re-evaluated prior to inductionOxygen Delivery Method: Circle system utilized Preoxygenation: Pre-oxygenation with 100% oxygen Intubation Type: IV induction Ventilation: Mask ventilation without difficulty LMA: LMA inserted LMA Size: 4.0 Tube type: Oral Number of attempts: 1 Placement Confirmation: positive ETCO2 and breath sounds checked- equal and bilateral Tube secured with: Tape Dental Injury: Teeth and Oropharynx as per pre-operative assessment

## 2016-06-24 NOTE — Op Note (Signed)
Preoperative diagnosis: Urinary calculus obstructing prostatic urethra  Postoperative diagnosis: Same   Procedure: 1.Holmium laser lithotripsy of urethral calculus                        2. Foley catheter placement  Surgeon: Otelia Limes. Yves Dill MD  Anesthesia: General  Indications:See the history and physical. After informed consent the above procedure(s) were requested     Technique and findings: After adequate general anesthesia had been obtained patient was placed into dorsal lithotomy position and the perineum was prepped and draped in the usual fashion. The 78 French the scope was coupled to the camera and visually advanced into the urethra. A spiculated stone was encountered in the prostatic urethra and appeared to be embedded posteriorly. The cystoscope was removed and exchanged for the short mini ureteroscope. The ureteroscope was then positioned adjacent to the stone and the 550  holmium laser fiber was introduced and set at a frequency of 10 power level I. The stone was then fully disintegrated into powder. The ureteroscope was then advanced into the bladder. No bladder tumors were identified. Both ureteral orifices were identified and had clear reflux. The prostatic urethra was widely patent with no evidence of bladder neck contracture or stricture. The ureteroscope was then removed and 10 cc of viscous Xylocaine instilled within the urethra. A 16 French Foley catheter was placed. A B&O suppository placed. Procedure was then terminated and patient transferred to the recovery room in stable condition.

## 2016-06-24 NOTE — Anesthesia Post-op Follow-up Note (Cosign Needed)
Anesthesia QCDR form completed.        

## 2016-06-25 ENCOUNTER — Encounter: Payer: Self-pay | Admitting: Urology

## 2016-08-04 ENCOUNTER — Encounter: Payer: Self-pay | Admitting: Family Medicine

## 2016-08-04 ENCOUNTER — Ambulatory Visit (INDEPENDENT_AMBULATORY_CARE_PROVIDER_SITE_OTHER): Payer: BLUE CROSS/BLUE SHIELD | Admitting: Family Medicine

## 2016-08-04 VITALS — BP 122/70 | HR 68 | Temp 98.6°F | Resp 16 | Ht 67.0 in | Wt 201.8 lb

## 2016-08-04 DIAGNOSIS — Z0000 Encounter for general adult medical examination without abnormal findings: Secondary | ICD-10-CM | POA: Diagnosis not present

## 2016-08-04 DIAGNOSIS — Z13220 Encounter for screening for lipoid disorders: Secondary | ICD-10-CM

## 2016-08-04 DIAGNOSIS — E669 Obesity, unspecified: Secondary | ICD-10-CM

## 2016-08-04 DIAGNOSIS — Z23 Encounter for immunization: Secondary | ICD-10-CM

## 2016-08-04 DIAGNOSIS — Z1211 Encounter for screening for malignant neoplasm of colon: Secondary | ICD-10-CM | POA: Diagnosis not present

## 2016-08-04 DIAGNOSIS — E66811 Obesity, class 1: Secondary | ICD-10-CM

## 2016-08-04 DIAGNOSIS — Z0001 Encounter for general adult medical examination with abnormal findings: Secondary | ICD-10-CM

## 2016-08-04 DIAGNOSIS — Z1159 Encounter for screening for other viral diseases: Secondary | ICD-10-CM | POA: Diagnosis not present

## 2016-08-04 NOTE — Assessment & Plan Note (Signed)
Physical exam completed today. Prostate exam and PSAs followed by urology. Encouraged diet and exercise. Discussed decreasing muffin and soda intake. Tetanus vaccination given. GI referral for colonoscopy ordered. We'll get him set up for lung cancer screening given his smoking history. Lab work ordered as outlined below. Encouraged him to continue hearing screening through work. Discussed monitoring his vision and following up with ophthalmology. Offered referral to vascular surgery given his varicose veins though he declined.

## 2016-08-04 NOTE — Patient Instructions (Signed)
Nice to see you. We will contact her regarding colonoscopy and lung cancer screening. We will have you return for fasting lab work. Please incorporate vegetables and stay active.

## 2016-08-04 NOTE — Progress Notes (Signed)
Tommi Rumps, MD Phone: [phone]  Brian Luna is a 61 y.o. male who presents today for physical exam.  Exercises by staying active at work. 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 Eats muffins for breakfast. Sandwich at lunch. Does eat a meat and healthy vegetables at dinner. Diet soda. Mostly unsweet tea. No prior hepatitis C testing. No prior HIV testing. Due for tetanus vaccination. Due for colonoscopy. Follows with urology for his PSAs. 60-pack-year history of smoking. Rare alcohol use. No illicit drug use. Reports he had a bladder stone recently and saw urology. They hospitalized him and used laser to remove this. Wears glasses. Has trouble hearing as well that is chronic. 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 Does wear ear protection. Has yearly hearing checks through work.  Notes chronic left leg swelling related to varicose veins. He's had the vein stripped in his legs multiple times. Did have a clot many years ago. He has no pain or changes to his leg.   Active Ambulatory Problems    Diagnosis Date Noted  . Chest pain 02/13/2015  . Vertigo 02/13/2015  . Elevated LDL cholesterol level 03/19/2015  . GERD (gastroesophageal reflux disease) 06/01/2015  . Obesity 06/01/2015  . Encounter for general adult medical examination with abnormal findings 08/04/2016   Resolved Ambulatory Problems    Diagnosis Date Noted  . No Resolved Ambulatory Problems   Past Medical History:  Diagnosis Date  . Blood in stool   . Chickenpox   . Depression   . GERD (gastroesophageal reflux disease)   . History of kidney stones   . Prostate cancer ([place])   . Varicose veins   . Vertigo     Family History  Problem Relation Age of Onset  . Arthritis Unknown   . Heart attack Father 33  . Heart attack Paternal Grandfather 4    Social History   Social History  . Marital status: Married    Spouse name: N/A  . Number of children: N/A  . Years of education: N/A    Occupational History  . Not on file.   Social History Main Topics  . Smoking status: Former Smoker    Quit date: 06/19/2012  . Smokeless tobacco: Never Used  . Alcohol use 0.0 oz/week     Comment: Occasional  . Drug use: No  . Sexual activity: Not on file   Other Topics Concern  . Not on file   Social History Narrative  . No narrative on file    ROS  General:  Negative for nexplained weight loss, fever Skin: Negative for new or changing mole, sore that won't heal HEENT: Positive for trouble hearing, trouble seeing, ringing in ears, negative for mouth sores, hoarseness, change in voice, dysphagia. CV:  Positive for varicose veins, Negative for chest pain, dyspnea, edema, palpitations Resp: Negative for cough, dyspnea, hemoptysis GI: Negative for nausea, vomiting, diarrhea, constipation, abdominal pain, melena, hematochezia. GU: Positive for dysuria, incontinence, negative for urinary hesitance, hematuria, vaginal or penile discharge, polyuria, sexual difficulty, lumps in testicle or breasts MSK: Negative for muscle cramps or aches, joint pain or swelling Neuro: Negative for headaches, weakness, numbness, dizziness, passing out/fainting Psych: Negative for depression, anxiety, positive for memory problems (can't remember where he put things at times, no other memory issues or ADL issues)  Objective  Physical Exam Vitals:   08/04/16 1608  BP: 122/70  Pulse: 68  Resp: 16  Temp: 98.6 F (37 C)    BP  Readings from Last 3 Encounters:  08/04/16 122/70  06/24/16 115/83  06/01/15 122/74   Wt Readings from Last 3 Encounters:  08/04/16 201 lb 12.8 oz (91.5 kg)  06/24/16 195 lb (88.5 kg)  06/01/15 195 lb 3.2 oz (88.5 kg)    Physical Exam  Constitutional: No distress.  HENT:  Head: Normocephalic and atraumatic.  Mouth/Throat: Oropharynx is clear and moist.  Eyes: Pupils are equal, round, and reactive to light. Conjunctivae are normal.  Cardiovascular: Normal rate,  regular rhythm and normal heart sounds.   Left lower extremity with prominent varicose veins proximally in the upper medial thigh and distally over the medial aspect of the calf, no tenderness, no firmness or warmth noted  Pulmonary/Chest: Effort normal and breath sounds normal.  Abdominal: Soft. Bowel sounds are normal. He exhibits no distension. There is no tenderness.  Musculoskeletal: He exhibits no edema.  Neurological: He is alert. Gait normal.  Skin: Skin is warm and dry. He is not diaphoretic.  Psychiatric: Mood and affect normal.     Assessment/Plan:   Encounter for general adult medical examination with abnormal findings Physical exam completed today. Prostate exam and PSAs followed by urology. Encouraged diet and exercise. Discussed decreasing muffin and soda intake. Tetanus vaccination given. GI referral for colonoscopy ordered. We'll get him set up for lung cancer screening given his smoking history. Lab work ordered as outlined below. Encouraged him to continue hearing screening through work. Discussed monitoring his vision and following up with ophthalmology. Offered referral to vascular surgery given his varicose veins though he declined.   Orders Placed This Encounter  Procedures  . Tdap vaccine greater than or equal to 7yo IM  . Lipid panel    Standing Status:   Future    Standing Expiration Date:   08/04/2017  . Comp Met (CMET)    Standing Status:   Future    Standing Expiration Date:   08/04/2017  . Hepatitis C antibody    Standing Status:   Future    Standing Expiration Date:   08/04/2017  . Hemoglobin A1c    Standing Status:   Future    Standing Expiration Date:   08/04/2017  . Ambulatory referral to Gastroenterology    Referral Priority:   Routine    Referral Type:   Consultation    Referral Reason:   Specialty Services Required    Number of Visits Requested:   1    No orders of the defined types were placed in this encounter.    Tommi Rumps,  MD [place]

## 2016-09-02 ENCOUNTER — Other Ambulatory Visit: Payer: Self-pay | Admitting: *Deleted

## 2016-09-02 NOTE — Telephone Encounter (Signed)
Last OV 08/04/16 filed under historical

## 2016-09-02 NOTE — Telephone Encounter (Signed)
Please find out what he takes this for then I will consider refilling. Thanks.

## 2016-09-02 NOTE — Telephone Encounter (Signed)
Pt requested a medication refill for meclizine  Pharmacy [facility]

## 2016-09-03 ENCOUNTER — Other Ambulatory Visit (INDEPENDENT_AMBULATORY_CARE_PROVIDER_SITE_OTHER): Payer: BLUE CROSS/BLUE SHIELD

## 2016-09-03 DIAGNOSIS — E669 Obesity, unspecified: Secondary | ICD-10-CM | POA: Diagnosis not present

## 2016-09-03 DIAGNOSIS — Z1159 Encounter for screening for other viral diseases: Secondary | ICD-10-CM

## 2016-09-03 DIAGNOSIS — Z13220 Encounter for screening for lipoid disorders: Secondary | ICD-10-CM | POA: Diagnosis not present

## 2016-09-03 DIAGNOSIS — E66811 Obesity, class 1: Secondary | ICD-10-CM

## 2016-09-03 LAB — LIPID PANEL
CHOL/HDL RATIO: 4
Cholesterol: 165 mg/dL (ref 0–200)
HDL: 40.7 mg/dL (ref >39.00)
LDL Cholesterol: 104 mg/dL — ABNORMAL HIGH (ref 0–99)
NONHDL: 124.62
TRIGLYCERIDES: 103 mg/dL (ref 0.0–149.0)
VLDL: 20.6 mg/dL (ref 0.0–40.0)

## 2016-09-03 LAB — COMPREHENSIVE METABOLIC PANEL
ALK PHOS: 64 U/L (ref 39–117)
ALT: 23 U/L (ref 0–53)
AST: 20 U/L (ref 0–37)
Albumin: 4.1 g/dL (ref 3.5–5.2)
BILIRUBIN TOTAL: 0.7 mg/dL (ref 0.2–1.2)
BUN: 13 mg/dL (ref 6–23)
CALCIUM: 9.6 mg/dL (ref 8.4–10.5)
CO2: 32 meq/L (ref 19–32)
CREATININE: 1 mg/dL (ref 0.40–1.50)
Chloride: 105 mEq/L (ref 96–112)
GFR: 80.99 mL/min (ref >60.00)
Glucose, Bld: 94 mg/dL (ref 70–99)
POTASSIUM: 5 meq/L (ref 3.5–5.1)
Sodium: 140 mEq/L (ref 135–145)
TOTAL PROTEIN: 7 g/dL (ref 6.0–8.3)

## 2016-09-03 LAB — HEMOGLOBIN A1C: Hgb A1c MFr Bld: 5 % (ref 4.6–6.5)

## 2016-09-04 LAB — HEPATITIS C ANTIBODY: HCV Ab: NONREACTIVE

## 2016-09-04 NOTE — Telephone Encounter (Signed)
Left message to return call 

## 2016-09-04 NOTE — Telephone Encounter (Signed)
Patient states he takes this for vertigo as needed 2-3 times yearly

## 2016-09-05 MED ORDER — MECLIZINE HCL 25 MG PO TABS: 12.5000 mg | ORAL_TABLET | Freq: Three times a day (TID) | ORAL | 0 refills | Status: DC | PRN

## 2016-09-22 DIAGNOSIS — D400 Neoplasm of uncertain behavior of prostate: Secondary | ICD-10-CM | POA: Diagnosis not present

## 2016-09-22 DIAGNOSIS — C61 Malignant neoplasm of prostate: Secondary | ICD-10-CM | POA: Diagnosis not present

## 2016-10-07 ENCOUNTER — Telehealth: Payer: Self-pay | Admitting: Family Medicine

## 2016-10-07 DIAGNOSIS — I359 Nonrheumatic aortic valve disorder, unspecified: Secondary | ICD-10-CM

## 2016-10-07 NOTE — Telephone Encounter (Signed)
Please let the patient know that I will forward to the [facility] to get the screening process set up. Thanks.

## 2016-10-07 NOTE — Telephone Encounter (Signed)
Pt's wife lvm stating that he was suppose to be referred for lung screening d/t smoking history. No referral or order in place. Pt cb [phone]

## 2016-10-08 ENCOUNTER — Telehealth: Payer: Self-pay | Admitting: *Deleted

## 2016-10-08 DIAGNOSIS — Z122 Encounter for screening for malignant neoplasm of respiratory organs: Secondary | ICD-10-CM

## 2016-10-08 DIAGNOSIS — Z87891 Personal history of nicotine dependence: Secondary | ICD-10-CM

## 2016-10-08 NOTE — Telephone Encounter (Signed)
They can get this information when the cancer Center contacts them. If they do not hear anything that should contact us again.

## 2016-10-08 NOTE — Telephone Encounter (Signed)
Patient notified

## 2016-10-08 NOTE — Telephone Encounter (Signed)
Received referral for initial lung cancer screening scan. Contacted patient and obtained smoking history,(former, quit 2014, 60 pack year) as well as answering questions related to screening process. Patient denies signs of lung cancer such as weight loss or hemoptysis. Patient denies comorbidity that would prevent curative treatment if lung cancer were found. Patient is scheduled for shared decision making visit and CT scan on 10/15/16.

## 2016-10-08 NOTE — Telephone Encounter (Signed)
Patient wife notified and she would like to have the procedure code first to see if insurance will cover this

## 2016-10-15 ENCOUNTER — Ambulatory Visit
Admission: RE | Admit: 2016-10-15 | Discharge: 2016-10-15 | Disposition: A | Discharge status: Home / Self Care | Payer: BLUE CROSS/BLUE SHIELD | Source: Ambulatory Visit | Attending: Nurse Practitioner | Admitting: Nurse Practitioner

## 2016-10-15 ENCOUNTER — Encounter: Payer: Self-pay | Admitting: Nurse Practitioner

## 2016-10-15 ENCOUNTER — Inpatient Hospital Stay: Payer: BLUE CROSS/BLUE SHIELD | Attending: Nurse Practitioner | Admitting: Nurse Practitioner

## 2016-10-15 DIAGNOSIS — J439 Emphysema, unspecified: Secondary | ICD-10-CM | POA: Diagnosis not present

## 2016-10-15 DIAGNOSIS — I700 Atherosclerosis of aorta: Secondary | ICD-10-CM | POA: Diagnosis not present

## 2016-10-15 DIAGNOSIS — Z122 Encounter for screening for malignant neoplasm of respiratory organs: Secondary | ICD-10-CM | POA: Diagnosis not present

## 2016-10-15 DIAGNOSIS — I2510 Atherosclerotic heart disease of native coronary artery without angina pectoris: Secondary | ICD-10-CM | POA: Diagnosis not present

## 2016-10-15 DIAGNOSIS — Z87891 Personal history of nicotine dependence: Secondary | ICD-10-CM | POA: Diagnosis not present

## 2016-10-15 NOTE — Progress Notes (Signed)
In accordance with CMS guidelines, patient has met eligibility criteria including age, absence of signs or symptoms of lung cancer.  Social History  Substance Use Topics  . Smoking status: Former Smoker    Packs/day: 1.50    Years: 40.00    Quit date: 06/19/2012  . Smokeless tobacco: Never Used  . Alcohol use 0.0 oz/week     Comment: Occasional     A shared decision-making session was conducted prior to the performance of CT scan. This includes one or more decision aids, includes benefits and harms of screening, follow-up diagnostic testing, over-diagnosis, false positive rate, and total radiation exposure.  Counseling on the importance of adherence to annual lung cancer LDCT screening, impact of co-morbidities, and ability or willingness to undergo diagnosis and treatment is imperative for compliance of the program.  Counseling on the importance of continued smoking cessation for former smokers; the importance of smoking cessation for current smokers, and information about tobacco cessation interventions have been given to patient including [place] and 1800 quit  programs.  Written order for lung cancer screening with LDCT has been given to the patient and any and all questions have been answered to the best of my abilities.   Yearly follow up will be coordinated by Burgess Estelle, Thoracic Navigator.  Faythe Casa, NP 10/15/2016 3:38 PM

## 2016-10-17 ENCOUNTER — Encounter: Payer: Self-pay | Admitting: *Deleted

## 2016-10-24 DIAGNOSIS — S0500XA Injury of conjunctiva and corneal abrasion without foreign body, unspecified eye, initial encounter: Secondary | ICD-10-CM | POA: Diagnosis not present

## 2016-10-25 ENCOUNTER — Telehealth: Payer: Self-pay | Admitting: Family Medicine

## 2016-10-25 DIAGNOSIS — E7800 Pure hypercholesterolemia, unspecified: Secondary | ICD-10-CM

## 2016-10-25 NOTE — Telephone Encounter (Signed)
Please let the patient know that I received a message from the [facility] regarding his CT chest. It appears he has coronary artery disease on the CT scan. Given this and his minimally elevated LDL cholesterol guidelines would dictate that he would benefit from being on a cholesterol medication to reduce his risk of stroke and heart attack in the future. If he is willing I can send this to his pharmacy. It also revealed some calcification on his aortic valve. We could consider doing an echo to evaluate this further.

## 2016-10-25 NOTE — Progress Notes (Signed)
The 10-year ASCVD risk score Mikey Bussing DC Brooke Bonito., et al., 2013) is: 7.7%   Values used to calculate the score:     Age: 61 years     Sex: Male     Is Non-Hispanic African American: No     Diabetic: No     Tobacco smoker: No     Systolic Blood Pressure: 212 mmHg     Is BP treated: No     HDL Cholesterol: 40.7 mg/dL     Total Cholesterol: 165 mg/dL

## 2016-10-27 MED ORDER — ROSUVASTATIN CALCIUM 20 MG PO TABS: 20.0000 mg | ORAL_TABLET | Freq: Every day | ORAL | 3 refills | Status: DC

## 2016-10-27 NOTE — Telephone Encounter (Signed)
Noted. Sent to pharmacy.  

## 2016-10-27 NOTE — Telephone Encounter (Signed)
Patient notified and would like to discuss echo with his wife first. Patient would rx for cholesterol med to be sent to [facility]. Patient is scheduled for follow up labs in 4 weeks, please place orders

## 2016-10-27 NOTE — Addendum Note (Signed)
Addended by: Leone Haven on: 10/27/2016 03:58 PM   Modules accepted: Orders

## 2016-10-28 NOTE — Telephone Encounter (Signed)
Patient states he does not have any of these sympotms

## 2016-10-28 NOTE — Telephone Encounter (Signed)
Noted. I would suggest proceeding with echo given calcification findings if his insurance will cover. Thanks.

## 2016-10-28 NOTE — Telephone Encounter (Signed)
Order placed. I will forward to [facility] to check with his insurance first. Please see if he is having any decreased exercise tolerance, shortness of breath, chest pain, or light headedness. Thanks.

## 2016-10-28 NOTE — Telephone Encounter (Signed)
Patient called back and would like echo done this year since he has met his out of pocket cost for the year. Patient would like to know if this is covered before having it done but would like to proceed.

## 2016-10-29 NOTE — Telephone Encounter (Signed)
Patient agrees and would like echo if covered

## 2016-11-03 DIAGNOSIS — Z01818 Encounter for other preprocedural examination: Secondary | ICD-10-CM | POA: Diagnosis not present

## 2016-11-03 DIAGNOSIS — Z1211 Encounter for screening for malignant neoplasm of colon: Secondary | ICD-10-CM | POA: Diagnosis not present

## 2016-11-10 ENCOUNTER — Telehealth: Payer: Self-pay | Admitting: *Deleted

## 2016-11-10 NOTE — Telephone Encounter (Signed)
Copied from [place] [id number]. Topic: Inquiry >> Nov 10, 2016 12:34 PM Vernona Rieger wrote: Reason for CRM:   [facility] called and said the patient wants to know if the ECHO will be covered by his insurance. Melissa at the office told her to call and schedule it but she needs to do know this first. He said he has not heard anything.  Can someone please give her a call back (barbara) [phone]. He said if his insurance will not cover it then he does not want it.

## 2016-11-11 NOTE — Telephone Encounter (Signed)
Pt is covered 100% no copay per [facility] NOM#[id number]  Pt was informed

## 2016-11-11 NOTE — Telephone Encounter (Signed)
Contacted insurnace for pt and it will be covered 100%. Pt is aware

## 2016-11-17 ENCOUNTER — Ambulatory Visit
Admission: RE | Admit: 2016-11-17 | Discharge: 2016-11-17 | Disposition: A | Discharge status: Home / Self Care | Payer: BLUE CROSS/BLUE SHIELD | Source: Ambulatory Visit | Attending: Family Medicine | Admitting: Family Medicine

## 2016-11-17 DIAGNOSIS — Z8546 Personal history of malignant neoplasm of prostate: Secondary | ICD-10-CM | POA: Diagnosis not present

## 2016-11-17 DIAGNOSIS — I517 Cardiomegaly: Secondary | ICD-10-CM | POA: Insufficient documentation

## 2016-11-17 DIAGNOSIS — K219 Gastro-esophageal reflux disease without esophagitis: Secondary | ICD-10-CM | POA: Diagnosis not present

## 2016-11-17 DIAGNOSIS — F329 Major depressive disorder, single episode, unspecified: Secondary | ICD-10-CM | POA: Insufficient documentation

## 2016-11-17 DIAGNOSIS — I359 Nonrheumatic aortic valve disorder, unspecified: Secondary | ICD-10-CM | POA: Diagnosis not present

## 2016-11-17 DIAGNOSIS — R42 Dizziness and giddiness: Secondary | ICD-10-CM | POA: Insufficient documentation

## 2016-11-17 NOTE — Progress Notes (Signed)
*  PRELIMINARY RESULTS* Echocardiogram 2D Echocardiogram has been performed.  Sherrie Sport 11/17/2016, 10:48 AM

## 2016-12-02 ENCOUNTER — Other Ambulatory Visit (INDEPENDENT_AMBULATORY_CARE_PROVIDER_SITE_OTHER): Payer: BLUE CROSS/BLUE SHIELD

## 2016-12-02 DIAGNOSIS — E7800 Pure hypercholesterolemia, unspecified: Secondary | ICD-10-CM | POA: Diagnosis not present

## 2016-12-02 LAB — HEPATIC FUNCTION PANEL
ALBUMIN: 4.4 g/dL (ref 3.5–5.2)
ALK PHOS: 63 U/L (ref 39–117)
ALT: 36 U/L (ref 0–53)
AST: 34 U/L (ref 0–37)
Bilirubin, Direct: 0.2 mg/dL (ref 0.0–0.3)
Total Bilirubin: 0.9 mg/dL (ref 0.2–1.2)
Total Protein: 7 g/dL (ref 6.0–8.3)

## 2016-12-02 LAB — LDL CHOLESTEROL, DIRECT: Direct LDL: 58 mg/dL

## 2016-12-19 DIAGNOSIS — K648 Other hemorrhoids: Secondary | ICD-10-CM | POA: Diagnosis not present

## 2016-12-19 DIAGNOSIS — K5730 Diverticulosis of large intestine without perforation or abscess without bleeding: Secondary | ICD-10-CM | POA: Diagnosis not present

## 2016-12-19 DIAGNOSIS — K640 First degree hemorrhoids: Secondary | ICD-10-CM | POA: Diagnosis not present

## 2016-12-19 DIAGNOSIS — Z1211 Encounter for screening for malignant neoplasm of colon: Secondary | ICD-10-CM | POA: Diagnosis not present

## 2017-03-30 DIAGNOSIS — C61 Malignant neoplasm of prostate: Secondary | ICD-10-CM | POA: Diagnosis not present

## 2017-03-30 DIAGNOSIS — D400 Neoplasm of uncertain behavior of prostate: Secondary | ICD-10-CM | POA: Diagnosis not present

## 2017-07-19 ENCOUNTER — Other Ambulatory Visit: Payer: Self-pay | Admitting: Family Medicine

## 2017-08-05 ENCOUNTER — Encounter: Payer: BLUE CROSS/BLUE SHIELD | Admitting: Family Medicine

## 2017-09-28 DIAGNOSIS — N5201 Erectile dysfunction due to arterial insufficiency: Secondary | ICD-10-CM | POA: Diagnosis not present

## 2017-09-28 DIAGNOSIS — D400 Neoplasm of uncertain behavior of prostate: Secondary | ICD-10-CM | POA: Diagnosis not present

## 2017-09-28 DIAGNOSIS — C61 Malignant neoplasm of prostate: Secondary | ICD-10-CM | POA: Diagnosis not present

## 2017-10-06 ENCOUNTER — Telehealth: Payer: Self-pay | Admitting: *Deleted

## 2017-10-06 NOTE — Telephone Encounter (Signed)
Attempted to contact patient r/t LDCT Screening follow up due at this time.  Patient is unsure if he wants to have this again, is seeing pcp in 2 weeks and wants to complete that and think about it.  Will call back.

## 2017-10-19 ENCOUNTER — Ambulatory Visit (INDEPENDENT_AMBULATORY_CARE_PROVIDER_SITE_OTHER): Payer: BLUE CROSS/BLUE SHIELD | Admitting: Family Medicine

## 2017-10-19 ENCOUNTER — Telehealth: Payer: Self-pay | Admitting: Nurse Practitioner

## 2017-10-19 ENCOUNTER — Encounter: Payer: Self-pay | Admitting: Family Medicine

## 2017-10-19 VITALS — BP 140/78 | HR 88 | Temp 98.0°F | Ht 66.5 in | Wt 205.4 lb

## 2017-10-19 DIAGNOSIS — Z23 Encounter for immunization: Secondary | ICD-10-CM

## 2017-10-19 DIAGNOSIS — E7800 Pure hypercholesterolemia, unspecified: Secondary | ICD-10-CM

## 2017-10-19 DIAGNOSIS — Z8546 Personal history of malignant neoplasm of prostate: Secondary | ICD-10-CM

## 2017-10-19 DIAGNOSIS — E6609 Other obesity due to excess calories: Secondary | ICD-10-CM

## 2017-10-19 DIAGNOSIS — Z6832 Body mass index (BMI) 32.0-32.9, adult: Secondary | ICD-10-CM | POA: Diagnosis not present

## 2017-10-19 DIAGNOSIS — Z0001 Encounter for general adult medical examination with abnormal findings: Secondary | ICD-10-CM | POA: Diagnosis not present

## 2017-10-19 DIAGNOSIS — R42 Dizziness and giddiness: Secondary | ICD-10-CM

## 2017-10-19 NOTE — Progress Notes (Signed)
Tommi Rumps, MD Phone: [phone]  Brian Luna is a 62 y.o. male who presents today for cpe.  Exercise: Patient stays very active at work. Diet: No specific dietary guidelines.  He does drink diet [facility].  Not much water.  He will eat a muffin in the morning.  A quick lunch.  Eats a good dinner. Colonoscopy done last year.  Will request records. He has a history of prostate cancer.  He follows with urology for this. Flu shot given today.  Tetanus vaccination up-to-date.  He will check with his insurance regarding coverage for Pneumovax given his history of emphysema and Shingrix. He declines HIV screening.  Hepatitis C screening up-to-date. Former smoker of 1.5 packs/day.  Quit 5 years ago.  Started at age 22.  Occasional alcohol use.  No illicit drug use.  He has dentures.  Sees an ophthalmologist once yearly. Vertigo is relatively stable.  Occurs off and on.  Occurs about twice a year.  Typically occurs when he rolls over in bed to get up.  Meclizine is beneficial. He does have chronic urinary frequency related to his prior prostate procedure.  Active Ambulatory Problems    Diagnosis Date Noted  . Chest pain 02/13/2015  . Vertigo 02/13/2015  . Elevated LDL cholesterol level 03/19/2015  . GERD (gastroesophageal reflux disease) 06/01/2015  . Obesity 06/01/2015  . Encounter for general adult medical examination with abnormal findings 08/04/2016  . History of prostate cancer 10/19/2017   Resolved Ambulatory Problems    Diagnosis Date Noted  . No Resolved Ambulatory Problems   Past Medical History:  Diagnosis Date  . Blood in stool   . Chickenpox   . Depression   . History of kidney stones   . Prostate cancer ([place])   . Varicose veins     Family History  Problem Relation Age of Onset  . Arthritis Unknown   . Heart attack Father 76  . Heart attack Paternal Grandfather 15    Social History   Socioeconomic History  . Marital status: Married    Spouse name:  Not on file  . Number of children: Not on file  . Years of education: Not on file  . Highest education level: Not on file  Occupational History  . Not on file  Social Needs  . Financial resource strain: Not on file  . Food insecurity:    Worry: Not on file    Inability: Not on file  . Transportation needs:    Medical: Not on file    Non-medical: Not on file  Tobacco Use  . Smoking status: Former Smoker    Packs/day: 1.50    Years: 40.00    Pack years: 60.00    Last attempt to quit: 06/19/2012    Years since quitting: 5.3  . Smokeless tobacco: Never Used  Substance and Sexual Activity  . Alcohol use: Yes    Alcohol/week: 0.0 standard drinks    Comment: Occasional  . Drug use: No  . Sexual activity: Not on file  Lifestyle  . Physical activity:    Days per week: Not on file    Minutes per session: Not on file  . Stress: Not on file  Relationships  . Social connections:    Talks on phone: Not on file    Gets together: Not on file    Attends religious service: Not on file    Active member of club or organization: Not on file    Attends meetings of clubs  or organizations: Not on file    Relationship status: Not on file  . Intimate partner violence:    Fear of current or ex partner: Not on file    Emotionally abused: Not on file    Physically abused: Not on file    Forced sexual activity: Not on file  Other Topics Concern  . Not on file  Social History Narrative  . Not on file    ROS  General:  Negative for nexplained weight loss, fever Skin: Negative for new or changing mole, sore that won't heal HEENT: Negative for trouble hearing, trouble seeing, ringing in ears, mouth sores, hoarseness, change in voice, dysphagia. CV:  Negative for chest pain, dyspnea, edema, palpitations Resp: Negative for cough, dyspnea, hemoptysis GI: Negative for nausea, vomiting, diarrhea, constipation, abdominal pain, melena, hematochezia. GU: Positive for frequent urination, Negative for  dysuria, incontinence, urinary hesitance, hematuria, vaginal or penile discharge, sexual difficulty, lumps in testicle or breasts MSK: Negative for muscle cramps or aches, joint pain or swelling Neuro: Positive for dizziness, Negative for headaches, weakness, numbness, passing out/fainting Psych: Negative for depression, anxiety, memory problems  Objective  Physical Exam Vitals:   10/19/17 1441  BP: 140/78  Pulse: 88  Temp: 98 F (36.7 C)  SpO2: 98%    BP Readings from Last 3 Encounters:  10/19/17 140/78  08/04/16 122/70  06/24/16 115/83   Wt Readings from Last 3 Encounters:  10/19/17 205 lb 6.4 oz (93.2 kg)  10/15/16 190 lb (86.2 kg)  08/04/16 201 lb 12.8 oz (91.5 kg)    Physical Exam  Constitutional: No distress.  HENT:  Head: Normocephalic and atraumatic.  Mouth/Throat: Oropharynx is clear and moist.  Eyes: Pupils are equal, round, and reactive to light. Conjunctivae are normal.  Neck: Neck supple.  Cardiovascular: Normal rate, regular rhythm and normal heart sounds.  Pulmonary/Chest: Effort normal and breath sounds normal.  Abdominal: Soft. Bowel sounds are normal. He exhibits no distension. There is no tenderness. There is no rebound and no guarding.  Musculoskeletal: He exhibits no edema.  Lymphadenopathy:    He has no cervical adenopathy.  Neurological: He is alert.  Skin: Skin is warm and dry. He is not diaphoretic.  Psychiatric: He has a normal mood and affect.     Assessment/Plan:   Encounter for general adult medical examination with abnormal findings Physical exam completed.  Discussed diet and exercise.  Will request colonoscopy records.  He will follow with urology for his history of prostate cancer.  He will check with his insurance regarding Pneumovax and Shingrix.  Lab work as outlined below.  Vertigo Rare issues with this.  He will continue to monitor.  History of prostate cancer Patient will continue to follow with his urologist.   Orders  Placed This Encounter  Procedures  . Flu Vaccine QUAD 6+ mos PF IM (Fluarix Quad PF)  . Lipid panel  . Comp Met (CMET)  . HgB A1c    No orders of the defined types were placed in this encounter.    Tommi Rumps, MD [place]

## 2017-10-19 NOTE — Patient Instructions (Signed)
Nice to see you. Please try to add some healthy options to your diet.  Please try to incorporate more water and less soda. Please check with your insurance regarding Pneumovax and Shingrix. We will contact you with your lab results.

## 2017-10-20 LAB — LIPID PANEL
CHOL/HDL RATIO: 3
Cholesterol: 100 mg/dL (ref 0–200)
HDL: 39.2 mg/dL (ref >39.00)
LDL Cholesterol: 40 mg/dL (ref 0–99)
NONHDL: 61.21
Triglycerides: 104 mg/dL (ref 0.0–149.0)
VLDL: 20.8 mg/dL (ref 0.0–40.0)

## 2017-10-20 LAB — COMPREHENSIVE METABOLIC PANEL
ALK PHOS: 58 U/L (ref 39–117)
ALT: 25 U/L (ref 0–53)
AST: 21 U/L (ref 0–37)
Albumin: 4.3 g/dL (ref 3.5–5.2)
BILIRUBIN TOTAL: 0.6 mg/dL (ref 0.2–1.2)
BUN: 12 mg/dL (ref 6–23)
CO2: 31 meq/L (ref 19–32)
Calcium: 9.4 mg/dL (ref 8.4–10.5)
Chloride: 105 mEq/L (ref 96–112)
Creatinine, Ser: 0.91 mg/dL (ref 0.40–1.50)
GFR: 89.96 mL/min (ref >60.00)
GLUCOSE: 62 mg/dL — AB (ref 70–99)
Potassium: 4.1 mEq/L (ref 3.5–5.1)
Sodium: 141 mEq/L (ref 135–145)
TOTAL PROTEIN: 6.5 g/dL (ref 6.0–8.3)

## 2017-10-20 LAB — HEMOGLOBIN A1C: Hgb A1c MFr Bld: 5 % (ref 4.6–6.5)

## 2017-10-20 NOTE — Assessment & Plan Note (Signed)
Physical exam completed.  Discussed diet and exercise.  Will request colonoscopy records.  He will follow with urology for his history of prostate cancer.  He will check with his insurance regarding Pneumovax and Shingrix.  Lab work as outlined below.

## 2017-10-20 NOTE — Assessment & Plan Note (Signed)
Rare issues with this.  He will continue to monitor.

## 2017-10-20 NOTE — Assessment & Plan Note (Signed)
Patient will continue to follow with his urologist.

## 2017-10-26 ENCOUNTER — Telehealth: Payer: Self-pay | Admitting: *Deleted

## 2017-10-26 ENCOUNTER — Encounter: Payer: Self-pay | Admitting: *Deleted

## 2017-10-26 NOTE — Telephone Encounter (Signed)
Attempted to contact patient r/t LDCT Screening follow up due at this time.  No answer received, message left for patient to call [phone] to schedule appointment.    

## 2017-10-28 ENCOUNTER — Telehealth: Payer: Self-pay

## 2017-10-28 NOTE — Telephone Encounter (Signed)
Call regarding lung screening. Left message at 11:11.

## 2017-11-05 DIAGNOSIS — H9202 Otalgia, left ear: Secondary | ICD-10-CM | POA: Diagnosis not present

## 2017-11-05 DIAGNOSIS — H6123 Impacted cerumen, bilateral: Secondary | ICD-10-CM | POA: Diagnosis not present

## 2018-07-18 ENCOUNTER — Other Ambulatory Visit: Payer: Self-pay | Admitting: Family Medicine

## 2018-09-13 ENCOUNTER — Emergency Department: Payer: BC Managed Care – PPO

## 2018-09-13 ENCOUNTER — Other Ambulatory Visit: Payer: Self-pay

## 2018-09-13 ENCOUNTER — Emergency Department
Admission: EM | Admit: 2018-09-13 | Discharge: 2018-09-13 | Disposition: A | Discharge status: Home / Self Care | Payer: BC Managed Care – PPO | Attending: Emergency Medicine | Admitting: Emergency Medicine

## 2018-09-13 ENCOUNTER — Encounter: Payer: Self-pay | Admitting: Emergency Medicine

## 2018-09-13 DIAGNOSIS — Y929 Unspecified place or not applicable: Secondary | ICD-10-CM | POA: Insufficient documentation

## 2018-09-13 DIAGNOSIS — Y999 Unspecified external cause status: Secondary | ICD-10-CM | POA: Diagnosis not present

## 2018-09-13 DIAGNOSIS — Y939 Activity, unspecified: Secondary | ICD-10-CM | POA: Insufficient documentation

## 2018-09-13 DIAGNOSIS — L03317 Cellulitis of buttock: Secondary | ICD-10-CM | POA: Diagnosis not present

## 2018-09-13 DIAGNOSIS — S8991XA Unspecified injury of right lower leg, initial encounter: Secondary | ICD-10-CM | POA: Diagnosis not present

## 2018-09-13 DIAGNOSIS — M25561 Pain in right knee: Secondary | ICD-10-CM | POA: Diagnosis not present

## 2018-09-13 DIAGNOSIS — X58XXXA Exposure to other specified factors, initial encounter: Secondary | ICD-10-CM | POA: Insufficient documentation

## 2018-09-13 DIAGNOSIS — S86911A Strain of unspecified muscle(s) and tendon(s) at lower leg level, right leg, initial encounter: Secondary | ICD-10-CM

## 2018-09-13 DIAGNOSIS — M7989 Other specified soft tissue disorders: Secondary | ICD-10-CM | POA: Diagnosis not present

## 2018-09-13 DIAGNOSIS — Z87891 Personal history of nicotine dependence: Secondary | ICD-10-CM | POA: Insufficient documentation

## 2018-09-13 DIAGNOSIS — I8002 Phlebitis and thrombophlebitis of superficial vessels of left lower extremity: Secondary | ICD-10-CM

## 2018-09-13 DIAGNOSIS — I82812 Embolism and thrombosis of superficial veins of left lower extremities: Secondary | ICD-10-CM | POA: Diagnosis not present

## 2018-09-13 DIAGNOSIS — Z8546 Personal history of malignant neoplasm of prostate: Secondary | ICD-10-CM | POA: Insufficient documentation

## 2018-09-13 DIAGNOSIS — Z79899 Other long term (current) drug therapy: Secondary | ICD-10-CM | POA: Diagnosis not present

## 2018-09-13 DIAGNOSIS — Z7982 Long term (current) use of aspirin: Secondary | ICD-10-CM | POA: Insufficient documentation

## 2018-09-13 MED ORDER — MELOXICAM 7.5 MG PO TABS
15.0000 mg | ORAL_TABLET | Freq: Once | ORAL | Status: AC
  Administered 2018-09-13: 15 mg via ORAL
  Filled 2018-09-13: qty 2

## 2018-09-13 MED ORDER — SULFAMETHOXAZOLE-TRIMETHOPRIM 800-160 MG PO TABS
1.0000 | ORAL_TABLET | Freq: Once | ORAL | Status: AC
  Administered 2018-09-13: 20:00:00 1 via ORAL
  Filled 2018-09-13: qty 1

## 2018-09-13 MED ORDER — SULFAMETHOXAZOLE-TRIMETHOPRIM 800-160 MG PO TABS: 1.0000 | ORAL_TABLET | Freq: Two times a day (BID) | ORAL | 0 refills | Status: DC

## 2018-09-13 MED ORDER — MELOXICAM 15 MG PO TABS: 15.0000 mg | ORAL_TABLET | Freq: Every day | ORAL | 0 refills | Status: DC

## 2018-09-13 NOTE — ED Provider Notes (Signed)
[facility] Emergency Department Provider Note  ____________________________________________  Time seen: Approximately 5:54 PM  I have reviewed the triage vital signs and the nursing notes.   HISTORY  Chief Complaint Knee Pain    HPI ROBBERT BENDALL is a 63 y.o. male who presents the emergency department for evaluation of multiple complaints.  Patient's primary complaint is right knee pain.  Patient has been experiencing sharp pain to the medial aspect of the right knee for the past week.  He has been using a over-the-counter brace and Advil with no significant relief.  Patient reports that he had an injury to the knee many years ago and received a cortisone injection not had problems in the knee until this week.  No definitive injury is reported.  Patient is also complaining of palpable lesions in the left medial thigh.  Patient reports that he has a history of a DVT and is concerned that this may be a return of same.  Patient had surgery to remove the clot to the left side DVT.  He is on daily aspirin for same.  Patient denies any significant edema to the knee.  All the symptoms are located to the medial thigh.  Patient is also concerned for skin changes to the right medial buttocks.  Patient reports that he first noticed the area as it was painful, it did enlarge but has improved somewhat.  Patient denies any drainage from the area.  He denies any rectal pain.  No fevers or chills.  No history of recurrent skin lesions.         Past Medical History:  Diagnosis Date  . Blood in stool   . Chickenpox   . Depression   . GERD (gastroesophageal reflux disease)   . History of kidney stones   . Prostate cancer ([place])   . Varicose veins    With clot of varicose vein  . Vertigo     Patient Active Problem List   Diagnosis Date Noted  . History of prostate cancer 10/19/2017  . Encounter for general adult medical examination with abnormal findings 08/04/2016  .  GERD (gastroesophageal reflux disease) 06/01/2015  . Obesity 06/01/2015  . Elevated LDL cholesterol level 03/19/2015  . Chest pain 02/13/2015  . Vertigo 02/13/2015    Past Surgical History:  Procedure Laterality Date  . CYSTOSCOPY WITH LITHOLAPAXY N/A 06/24/2016   Procedure: CYSTOSCOPY WITH LITHOLAPAXY WITH HOLMIUM LASER;  Surgeon: Royston Cowper, MD;  Location: [facility] ORS;  Service: Urology;  Laterality: N/A;  . HOLMIUM LASER APPLICATION N/A [id number]   Procedure: HOLMIUM LASER APPLICATION;  Surgeon: Royston Cowper, MD;  Location: [facility] ORS;  Service: Urology;  Laterality: N/A;  . MULTIPLE TOOTH EXTRACTIONS    . PROSTATE SURGERY    . Thrombosed hemorrhoids    . VARICOSE VEIN SURGERY      Prior to Admission medications   Medication Sig Start Date End Date Taking? Authorizing Provider  aspirin 81 MG tablet Take 81 mg by mouth daily.    [provider]  cyanocobalamin 2000 MCG tablet Take 2,000 mcg by mouth daily.    [provider]  esomeprazole (NEXIUM) 20 MG capsule Take 20 mg by mouth daily.     [provider]  Magnesium 500 MG TABS Take 500 mg by mouth daily.    [provider]  meclizine (ANTIVERT) 25 MG tablet Take 0.5 tablets (12.5 mg total) by mouth 3 (three) times daily as needed (inner ear). 09/05/16  Leone Haven, MD  meloxicam (MOBIC) 15 MG tablet Take 1 tablet (15 mg total) by mouth daily. 09/13/18   Cuthriell, Charline Bills, PA-C  naproxen sodium (ALEVE) 220 MG tablet Take 440 mg by mouth daily.    [provider]  niacin 500 MG tablet Take 500 mg by mouth daily.     [provider]  Potassium 99 MG TABS Take 99 mg by mouth daily.    [provider]  ranitidine (ZANTAC) 150 MG tablet Take 150 mg by mouth daily.     [provider]  rosuvastatin (CRESTOR) 20 MG tablet TAKE 1 TABLET BY MOUTH ONCE DAILY 07/20/18   Leone Haven, MD  sulfamethoxazole-trimethoprim (BACTRIM DS) 800-160 MG tablet Take  1 tablet by mouth 2 (two) times daily. 09/13/18   Cuthriell, Charline Bills, PA-C  tadalafil (CIALIS) 20 MG tablet Take 20 mg by mouth daily as needed for erectile dysfunction.    [provider]    Allergies Patient has no known allergies.  Family History  Problem Relation Age of Onset  . Arthritis Other   . Heart attack Father 42  . Heart attack Paternal Grandfather 37    Social History Social History   Tobacco Use  . Smoking status: Former Smoker    Packs/day: 1.50    Years: 40.00    Pack years: 60.00    Quit date: 06/19/2012    Years since quitting: 6.2  . Smokeless tobacco: Never Used  Substance Use Topics  . Alcohol use: Yes    Alcohol/week: 0.0 standard drinks    Comment: Occasional  . Drug use: No     Review of Systems  Constitutional: No fever/chills Eyes: No visual changes. No discharge ENT: No upper respiratory complaints. Cardiovascular: no chest pain. Respiratory: no cough. No SOB. Gastrointestinal: No abdominal pain.  No nausea, no vomiting.  No diarrhea.  No constipation. Musculoskeletal: Positive for right knee pain.  Positive for left medial thigh pain, palpable lesions and erythema. Skin: Positive for skin changes to the right medial buttocks Neurological: Negative for headaches, focal weakness or numbness. 10-point ROS otherwise negative.  ____________________________________________   PHYSICAL EXAM:  VITAL SIGNS: ED Triage Vitals  Enc Vitals Group     BP 09/13/18 1635 (!) 155/79     Pulse Rate 09/13/18 1635 85     Resp 09/13/18 1635 16     Temp 09/13/18 1635 98.5 F (36.9 C)     Temp Source 09/13/18 1635 Oral     SpO2 09/13/18 1635 94 %     Weight 09/13/18 1639 207 lb (93.9 kg)     Height 09/13/18 1639 5\' 7"  (1.702 m)     Head Circumference --      Peak Flow --      Pain Score 09/13/18 1635 2     Pain Loc --      Pain Edu? --      Excl. in [place]? --      Constitutional: Alert and oriented. Well appearing and in no acute  distress. Eyes: Conjunctivae are normal. PERRL. EOMI. Head: Atraumatic. ENT:      Ears:       Nose: No congestion/rhinnorhea.      Mouth/Throat: Mucous membranes are moist.  Neck: No stridor.    Cardiovascular: Normal rate, regular rhythm. Normal S1 and S2.  Good peripheral circulation. Respiratory: Normal respiratory effort without tachypnea or retractions. Lungs CTAB. Good air entry to the bases with no decreased or absent breath  sounds. Gastrointestinal: Bowel sounds 4 quadrants. Soft and nontender to palpation. No guarding or rigidity. No palpable masses. No distention. No CVA tenderness. Musculoskeletal: Full range of motion to all extremities. No gross deformities appreciated.  Visualization of the right knee reveals no gross edema, erythema, deformity.  Full range of motion to the knee.  Patient is mildly tender to palpation along the medial joint line.  Varus, valgus, Lachman's and McMurray's is negative.  Dorsalis pedis pulses sensation intact distally.  Examination of the left lower extremity reveals linear erythema with underlying palpable lesion.  There is no evidence of cellulitis to the skin.  No fluctuance or induration on palpation.  Patient has linear distribution of firm lesion.  Distal to this area, visualization of a old surgical scar consistent with thrombectomy from previous DVT. Neurologic:  Normal speech and language. No gross focal neurologic deficits are appreciated.  Skin:  Skin is warm, dry and intact. No rash noted.  Visualization on the buttocks reveals erythematous and edematous lesion consistent with cellulitis to the right medial buttocks.  No fluctuance or induration.  No purulent drainage.  This does not cross the intergluteal cleft.  No perirectal/perianal involvement. Psychiatric: Mood and affect are normal. Speech and behavior are normal. Patient exhibits appropriate insight and judgement.   ____________________________________________   LABS (all labs  ordered are listed, but only abnormal results are displayed)  Labs Reviewed - No data to display ____________________________________________  EKG   ____________________________________________  RADIOLOGY I personally viewed and evaluated these images as part of my medical decision making, as well as reviewing the written report by the radiologist.  US Venous Img Lower Unilateral Left  Result Date: 09/13/2018 CLINICAL DATA:  Linear lesions the medial thigh history of DVT EXAM: LEFT LOWER EXTREMITY VENOUS DOPPLER ULTRASOUND TECHNIQUE: Gray-scale sonography with graded compression, as well as color Doppler and duplex ultrasound were performed to evaluate the lower extremity deep venous systems from the level of the common femoral vein and including the common femoral, femoral, profunda femoral, popliteal and calf veins including the posterior tibial, peroneal and gastrocnemius veins when visible. The superficial great saphenous vein was also interrogated. Spectral Doppler was utilized to evaluate flow at rest and with distal augmentation maneuvers in the common femoral, femoral and popliteal veins. COMPARISON:  None. FINDINGS: Contralateral Common Femoral Vein: Respiratory phasicity is normal and symmetric with the symptomatic side. No evidence of thrombus. Normal compressibility. Common Femoral Vein: No evidence of thrombus. Normal compressibility, respiratory phasicity and response to augmentation. Saphenofemoral Junction: No evidence of thrombus. Normal compressibility and flow on color Doppler imaging. Profunda Femoral Vein: No evidence of thrombus. Normal compressibility and flow on color Doppler imaging. Femoral Vein: No evidence of thrombus. Normal compressibility, respiratory phasicity and response to augmentation. Popliteal Vein: No evidence of thrombus. Normal compressibility, respiratory phasicity and response to augmentation. Calf Veins: No evidence of thrombus. Normal compressibility and  flow on color Doppler imaging. Superficial Great Saphenous Vein: There is a partially occlusive thrombus which is partially compressible. This extends to the varicosity in the medial left upper thigh. Venous Reflux:  None. Other Findings:  None. IMPRESSION: No evidence of deep venous thrombosis. Partially occlusive superficial thrombophlebitis of the left great saphenous vein. These results were called by telephone at the time of interpretation on 09/13/2018 at 7:02 pm to [facility] , who verbally acknowledged these results. Electronically Signed   By: Prudencio Pair M.D.   On: 09/13/2018 19:05   Dg Knee Complete 4 Views Right  Result  Date: 09/13/2018 CLINICAL DATA:  Pain and swelling RIGHT knee for 1 week, no known injury, anterior pain distal to patella and medial knee pain EXAM: RIGHT KNEE - COMPLETE 4+ VIEW COMPARISON:  None FINDINGS: Osseous mineralization low normal. Joint spaces preserved. No acute fracture, dislocation, or bone destruction. No significant joint effusion. Serpiginous soft tissue densities at medial aspect of RIGHT leg question venous varicosities. IMPRESSION: No acute osseous abnormalities. 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 Electronically Signed   By: Lavonia Dana M.D.   On: 09/13/2018 17:13    ____________________________________________    PROCEDURES  Procedure(s) performed:    Procedures    Medications  sulfamethoxazole-trimethoprim (BACTRIM DS) 800-160 MG per tablet 1 tablet (1 tablet Oral Given 09/13/18 1946)  meloxicam (MOBIC) tablet 15 mg (15 mg Oral Given 09/13/18 1946)     ____________________________________________   INITIAL IMPRESSION / ASSESSMENT AND PLAN / ED COURSE  Pertinent labs & imaging results that were available during my care of the patient were reviewed by me and considered in my medical decision making (see chart for details).  Review of the [place] CSRS was performed in accordance of the [place] prior to dispensing any  controlled drugs.           Patient's diagnosis is consistent with knee strain, superficial thrombophlebitis, cellulitis.  Patient presents emergency department multiple complaints.  Patient's primary complaint was right knee pain.  Exam was overall reassuring and patient will be placed on anti-inflammatory for same.  X-ray reveals no acute findings.  Patient had findings concerning for DVT versus thrombophlebitis of the left lower extremity.  Ultrasound reveals superficial thrombophlebitis that is not fully occlusive.  As such patient will be treated conservatively.  Again anti-inflammatories for the right knee will assist in healing of superficial thrombophlebitis.  Patient is to use warm compresses, wrapped the area.  I have recommended the patient follow-up with his primary care for repeat ultrasound as patient does have a previous history of DVT.  Patient is agreeable with this plan.  At this time no indication for anticoagulation.  Patient is findings consistent with cellulitis without any indication of abscess to the right buttocks.  Patient will be started on antibiotics for same.  Follow-up with primary care as described above..  Patient is given ED precautions to return to the ED for any worsening or new symptoms.     ____________________________________________  FINAL CLINICAL IMPRESSION(S) / ED DIAGNOSES  Final diagnoses:  Strain of right knee, initial encounter  Thrombophlebitis of saphenous vein, left  Cellulitis of buttock      NEW MEDICATIONS STARTED DURING THIS VISIT:  ED Discharge Orders         Ordered    meloxicam (MOBIC) 15 MG tablet  Daily     09/13/18 1945    sulfamethoxazole-trimethoprim (BACTRIM DS) 800-160 MG tablet  2 times daily     09/13/18 1945              This chart was dictated using voice recognition software/Dragon. Despite best efforts to proofread, errors can occur which can change the meaning. Any change was purely unintentional.     Darletta Moll, PA-C 09/13/18 1947    Nena Polio, MD 09/13/18 2318

## 2018-09-13 NOTE — ED Notes (Signed)
See triage note  Presents with right knee pain and some swelling  States this started about 1 week ago w/o injury  States he has been wearing a brace to knee  Ambulates well to treatment room   Also noticed an vein to groin area that has "knots' there

## 2018-09-13 NOTE — ED Triage Notes (Signed)
FIRST NURSE NOTE-pt wants to be seen for knee pain and possible bug bites. Ambulatory without difficulty.

## 2018-09-13 NOTE — ED Triage Notes (Signed)
Patient presents to the ED with right knee pain and swelling x 1 week.  Patient denies any known injury.  Patient states he injured his knee previously and got a cortisone shot that improved pain and he has not had any issues with knee until this past week.  Patient is also concerned about a vein in his left upper leg that has, "knots" and a couple of swollen areas to his back that are not painful x 2 weeks.  Patient states, "I just want to get them checked out while I'm here, but my knee is really what's bothering me."

## 2018-10-04 DIAGNOSIS — C61 Malignant neoplasm of prostate: Secondary | ICD-10-CM | POA: Diagnosis not present

## 2018-10-04 DIAGNOSIS — D400 Neoplasm of uncertain behavior of prostate: Secondary | ICD-10-CM | POA: Diagnosis not present

## 2018-10-04 DIAGNOSIS — N5201 Erectile dysfunction due to arterial insufficiency: Secondary | ICD-10-CM | POA: Diagnosis not present

## 2018-10-19 ENCOUNTER — Other Ambulatory Visit: Payer: Self-pay

## 2018-10-22 ENCOUNTER — Ambulatory Visit (INDEPENDENT_AMBULATORY_CARE_PROVIDER_SITE_OTHER): Payer: BC Managed Care – PPO | Admitting: Family Medicine

## 2018-10-22 ENCOUNTER — Other Ambulatory Visit: Payer: Self-pay

## 2018-10-22 ENCOUNTER — Encounter: Payer: Self-pay | Admitting: Family Medicine

## 2018-10-22 VITALS — BP 140/80 | HR 64 | Temp 97.9°F | Ht 67.0 in | Wt 206.0 lb

## 2018-10-22 DIAGNOSIS — L918 Other hypertrophic disorders of the skin: Secondary | ICD-10-CM | POA: Diagnosis not present

## 2018-10-22 DIAGNOSIS — Z130 Encounter for screening for diseases of the blood and blood-forming organs and certain disorders involving the immune mechanism: Secondary | ICD-10-CM

## 2018-10-22 DIAGNOSIS — E7800 Pure hypercholesterolemia, unspecified: Secondary | ICD-10-CM

## 2018-10-22 DIAGNOSIS — M25561 Pain in right knee: Secondary | ICD-10-CM | POA: Diagnosis not present

## 2018-10-22 DIAGNOSIS — Z0001 Encounter for general adult medical examination with abnormal findings: Secondary | ICD-10-CM | POA: Diagnosis not present

## 2018-10-22 DIAGNOSIS — Z1329 Encounter for screening for other suspected endocrine disorder: Secondary | ICD-10-CM

## 2018-10-22 DIAGNOSIS — E6609 Other obesity due to excess calories: Secondary | ICD-10-CM | POA: Diagnosis not present

## 2018-10-22 DIAGNOSIS — G8929 Other chronic pain: Secondary | ICD-10-CM

## 2018-10-22 DIAGNOSIS — Z6832 Body mass index (BMI) 32.0-32.9, adult: Secondary | ICD-10-CM

## 2018-10-22 NOTE — Patient Instructions (Signed)
Nice to see you. Please try to stay active and monitor your diet. Please consider the shingles vaccine. If you change your mind on lung cancer screening please let us know. We will get lab work today and then determine if we can place you on the meloxicam. I will refer you to dermatology.  Please do not take ibuprofen when you are taking Aleve.

## 2018-10-22 NOTE — Assessment & Plan Note (Signed)
Suspect prior strain of knee though could have some mild underlying arthritis.  Advised that he should not take Aleve and ibuprofen.  He will cut the ibuprofen out.  We will check kidney function.  Discussed he could use meloxicam if he has worsening knee pain though he would not be able to take any Aleve when he is taking the meloxicam.  We will send in meloxicam for him to use as needed once his renal function returns.

## 2018-10-22 NOTE — Assessment & Plan Note (Signed)
Refer to dermatology.  Appears benign.

## 2018-10-22 NOTE — Progress Notes (Signed)
Tommi Rumps, MD Phone: [phone]  Brian Luna is a 63 y.o. male who presents today for CPE.  Exercise: Patient notes he is active at work though no specific exercise. Diet: Eats a mixture of healthy foods and somewhat unhealthy foods with vegetables.  Does drink 6 diet sodas daily. Follows with urology for his history of prostate cancer. Colonoscopy in 2018 with 10-year recall. Flu vaccine done through work.  Tetanus vaccine up-to-date.  He will check with his insurance regarding Shingrix. He declines HIV screening. Patient is a former smoker of 1.5 packs daily.  Quit 5 years ago.  Started smoking at age 7.  He was doing lung cancer screening with CT imaging though he declines doing that moving forward.  Occasional alcohol use.  No illicit drug use. He has dentures.  He sees an ophthalmologist once yearly.  Right knee pain: This has been an intermittent issue for a number of years.  It worsened recently and he was evaluated in the ED with negative x-ray imaging.  They prescribed meloxicam which was quite beneficial.  He notes no swelling.  He notes some continued pain at times.  He does take Aleve 2 in the morning and typically 2 later in the day as well as an 800 mg ibuprofen with dinner.  Skin tag: Patient notes this is on the back of his right knee.  Is been present for quite some time.  He thinks it has gotten bigger recently.  It does not necessarily get all that irritated.  Active Ambulatory Problems    Diagnosis Date Noted  . Chest pain 02/13/2015  . Vertigo 02/13/2015  . Elevated LDL cholesterol level 03/19/2015  . GERD (gastroesophageal reflux disease) 06/01/2015  . Obesity 06/01/2015  . Encounter for general adult medical examination with abnormal findings 08/04/2016  . History of prostate cancer 10/19/2017  . Skin tag 10/22/2018  . Right knee pain 10/22/2018   Resolved Ambulatory Problems    Diagnosis Date Noted  . No Resolved Ambulatory Problems   Past  Medical History:  Diagnosis Date  . Blood in stool   . Chickenpox   . Depression   . History of kidney stones   . Prostate cancer ([place])   . Varicose veins     Family History  Problem Relation Age of Onset  . Arthritis Other   . Heart attack Father 62  . Heart attack Paternal Grandfather 6    Social History   Socioeconomic History  . Marital status: Married    Spouse name: Not on file  . Number of children: Not on file  . Years of education: Not on file  . Highest education level: Not on file  Occupational History  . Not on file  Social Needs  . Financial resource strain: Not on file  . Food insecurity    Worry: Not on file    Inability: Not on file  . Transportation needs    Medical: Not on file    Non-medical: Not on file  Tobacco Use  . Smoking status: Former Smoker    Packs/day: 1.50    Years: 40.00    Pack years: 60.00    Quit date: 06/19/2012    Years since quitting: 6.3  . Smokeless tobacco: Never Used  Substance and Sexual Activity  . Alcohol use: Yes    Alcohol/week: 0.0 standard drinks    Comment: Occasional  . Drug use: No  . Sexual activity: Not on file  Lifestyle  . Physical activity  Days per week: Not on file    Minutes per session: Not on file  . Stress: Not on file  Relationships  . Social Herbalist on phone: Not on file    Gets together: Not on file    Attends religious service: Not on file    Active member of club or organization: Not on file    Attends meetings of clubs or organizations: Not on file    Relationship status: Not on file  . Intimate partner violence    Fear of current or ex partner: Not on file    Emotionally abused: Not on file    Physically abused: Not on file    Forced sexual activity: Not on file  Other Topics Concern  . Not on file  Social History Narrative  . Not on file    ROS  General:  Negative for nexplained weight loss, fever Skin: Positive for new or changing mole, negative for sore  that won't heal HEENT: Negative for trouble hearing, trouble seeing, ringing in ears, mouth sores, hoarseness, change in voice, dysphagia. CV:  Negative for chest pain, dyspnea, edema, palpitations Resp: Negative for cough, dyspnea, hemoptysis GI: Negative for nausea, vomiting, diarrhea, constipation, abdominal pain, melena, hematochezia. GU: Negative for dysuria, incontinence, urinary hesitance, hematuria, vaginal or penile discharge, polyuria, sexual difficulty, lumps in testicle or breasts MSK: Positive for joint pain, negative for muscle cramps or aches, joint swelling Neuro: Negative for headaches, weakness, numbness, dizziness, passing out/fainting Psych: Negative for depression, anxiety, memory problems  Objective  Physical Exam Vitals:   10/22/18 1541 10/22/18 1601  BP: (!) 160/80 140/80  Pulse: 64   Temp: 97.9 F (36.6 C)   SpO2: 98%     BP Readings from Last 3 Encounters:  10/22/18 140/80  09/13/18 (!) 145/80  10/19/17 140/78   Wt Readings from Last 3 Encounters:  10/22/18 206 lb (93.4 kg)  09/13/18 207 lb (93.9 kg)  10/19/17 205 lb 6.4 oz (93.2 kg)    Physical Exam Constitutional:      General: He is not in acute distress.    Appearance: He is not diaphoretic.  HENT:     Head: Normocephalic and atraumatic.  Eyes:     Conjunctiva/sclera: Conjunctivae normal.     Pupils: Pupils are equal, round, and reactive to light.  Cardiovascular:     Rate and Rhythm: Normal rate and regular rhythm.     Heart sounds: Normal heart sounds.  Pulmonary:     Effort: Pulmonary effort is normal.     Breath sounds: Normal breath sounds.  Abdominal:     General: Bowel sounds are normal. There is no distension.     Palpations: Abdomen is soft.     Tenderness: There is no abdominal tenderness. There is no guarding or rebound.  Musculoskeletal:     Comments: Right knee with no swelling, warmth, erythema, no tenderness, negative McMurray's  Lymphadenopathy:     Cervical: No  cervical adenopathy.  Skin:    General: Skin is warm and dry.       Neurological:     Mental Status: He is alert.      Assessment/Plan:   Encounter for general adult medical examination with abnormal findings Physical exam completed.  He will continue to stay active and monitor his diet.  I encouraged him to decrease his soda intake.  I encouraged him to continue to proceed with lung cancer screening by CT though he has declined this at this  time.  He declines HIV screening.  He will check on Shingrix with his insurance.  Lab work as outlined below.  Skin tag Refer to dermatology.  Appears benign.  Right knee pain Suspect prior strain of knee though could have some mild underlying arthritis.  Advised that he should not take Aleve and ibuprofen.  He will cut the ibuprofen out.  We will check kidney function.  Discussed he could use meloxicam if he has worsening knee pain though he would not be able to take any Aleve when he is taking the meloxicam.  We will send in meloxicam for him to use as needed once his renal function returns.   Orders Placed This Encounter  Procedures  . Comp Met (CMET)  . Lipid panel  . HgB A1c  . CBC  . TSH  . Ambulatory referral to Dermatology    Referral Priority:   Routine    Referral Type:   Consultation    Referral Reason:   Specialty Services Required    Requested Specialty:   Dermatology    Number of Visits Requested:   1    No orders of the defined types were placed in this encounter.    Tommi Rumps, MD [place]

## 2018-10-22 NOTE — Assessment & Plan Note (Signed)
Physical exam completed.  He will continue to stay active and monitor his diet.  I encouraged him to decrease his soda intake.  I encouraged him to continue to proceed with lung cancer screening by CT though he has declined this at this time.  He declines HIV screening.  He will check on Shingrix with his insurance.  Lab work as outlined below.

## 2018-10-23 LAB — COMPREHENSIVE METABOLIC PANEL
AG Ratio: 2 (calc) (ref 1.0–2.5)
ALT: 22 U/L (ref 9–46)
AST: 22 U/L (ref 10–35)
Albumin: 4.2 g/dL (ref 3.6–5.1)
Alkaline phosphatase (APISO): 63 U/L (ref 35–144)
BUN: 13 mg/dL (ref 7–25)
CO2: 23 mmol/L (ref 20–32)
Calcium: 9.4 mg/dL (ref 8.6–10.3)
Chloride: 103 mmol/L (ref 98–110)
Creat: 1.01 mg/dL (ref 0.70–1.25)
Globulin: 2.1 g/dL (calc) (ref 1.9–3.7)
Glucose, Bld: 84 mg/dL (ref 65–99)
Potassium: 4.3 mmol/L (ref 3.5–5.3)
Sodium: 141 mmol/L (ref 135–146)
Total Bilirubin: 0.5 mg/dL (ref 0.2–1.2)
Total Protein: 6.3 g/dL (ref 6.1–8.1)

## 2018-10-23 LAB — LIPID PANEL
Cholesterol: 100 mg/dL (ref <200)
HDL: 41 mg/dL (ref >=40)
LDL Cholesterol (Calc): 43 mg/dL (calc)
Non-HDL Cholesterol (Calc): 59 mg/dL (calc) (ref <130)
Total CHOL/HDL Ratio: 2.4 (calc) (ref <5.0)
Triglycerides: 79 mg/dL (ref <150)

## 2018-10-23 LAB — HEMOGLOBIN A1C
Hgb A1c MFr Bld: 4.8 % of total Hgb (ref <5.7)
Mean Plasma Glucose: 91 (calc)
eAG (mmol/L): 5 (calc)

## 2018-10-23 LAB — TSH: TSH: 1.47 mIU/L (ref 0.40–4.50)

## 2018-10-23 LAB — CBC
HCT: 41.3 % (ref 38.5–50.0)
Hemoglobin: 14.3 g/dL (ref 13.2–17.1)
MCH: 31.3 pg (ref 27.0–33.0)
MCHC: 34.6 g/dL (ref 32.0–36.0)
MCV: 90.4 fL (ref 80.0–100.0)
MPV: 12.8 fL — ABNORMAL HIGH (ref 7.5–12.5)
Platelets: 207 10*3/uL (ref 140–400)
RBC: 4.57 10*6/uL (ref 4.20–5.80)
RDW: 13.1 % (ref 11.0–15.0)
WBC: 9.1 10*3/uL (ref 3.8–10.8)

## 2018-12-08 DIAGNOSIS — L820 Inflamed seborrheic keratosis: Secondary | ICD-10-CM | POA: Diagnosis not present

## 2018-12-08 DIAGNOSIS — B079 Viral wart, unspecified: Secondary | ICD-10-CM | POA: Diagnosis not present

## 2018-12-08 DIAGNOSIS — Q825 Congenital non-neoplastic nevus: Secondary | ICD-10-CM | POA: Diagnosis not present

## 2018-12-08 DIAGNOSIS — D485 Neoplasm of uncertain behavior of skin: Secondary | ICD-10-CM | POA: Diagnosis not present

## 2019-03-26 ENCOUNTER — Ambulatory Visit: Payer: BC Managed Care – PPO | Attending: Internal Medicine

## 2019-03-26 DIAGNOSIS — Z23 Encounter for immunization: Secondary | ICD-10-CM

## 2019-03-26 NOTE — Progress Notes (Signed)
   Covid-19 Vaccination Clinic  Name:  Brian Luna    MRN: [id number] DOB: Oct 29, 1955  03/26/2019  [name] was observed post Covid-19 immunization for 15 minutes without incident. He was provided with Vaccine Information Sheet and instruction to access the V-Safe system.   [name] was instructed to call 911 with any severe reactions post vaccine: Marland Kitchen Difficulty breathing  . Swelling of face and throat  . A fast heartbeat  . A bad rash all over body  . Dizziness and weakness   Immunizations Administered    Name Date Dose VIS Date Route   Pfizer COVID-19 Vaccine 03/26/2019 12:26 PM 0.3 mL 12/17/2018 Intramuscular   Manufacturer: [place]   Lot: [id number]   [place]: [id number]

## 2019-04-20 ENCOUNTER — Ambulatory Visit: Payer: BC Managed Care – PPO | Attending: Internal Medicine

## 2019-04-20 DIAGNOSIS — Z23 Encounter for immunization: Secondary | ICD-10-CM

## 2019-04-20 NOTE — Progress Notes (Signed)
   Covid-19 Vaccination Clinic  Name:  Brian Luna    MRN: [id number] DOB: 24-Apr-1955  04/20/2019  [name] was observed post Covid-19 immunization for 15 minutes without incident. He was provided with Vaccine Information Sheet and instruction to access the V-Safe system.   [name] was instructed to call 911 with any severe reactions post vaccine: Marland Kitchen Difficulty breathing  . Swelling of face and throat  . A fast heartbeat  . A bad rash all over body  . Dizziness and weakness   Immunizations Administered    Name Date Dose VIS Date Route   Pfizer COVID-19 Vaccine 04/20/2019  3:56 PM 0.3 mL 12/17/2018 Intramuscular   Manufacturer: [place]   Lot: [id number]   [place]: [id number]

## 2019-06-13 ENCOUNTER — Emergency Department
Admission: EM | Admit: 2019-06-13 | Discharge: 2019-06-13 | Disposition: A | Discharge status: Home / Self Care | Payer: BC Managed Care – PPO | Attending: Student | Admitting: Student

## 2019-06-13 ENCOUNTER — Encounter: Payer: Self-pay | Admitting: Emergency Medicine

## 2019-06-13 ENCOUNTER — Other Ambulatory Visit: Payer: Self-pay

## 2019-06-13 DIAGNOSIS — Z87891 Personal history of nicotine dependence: Secondary | ICD-10-CM | POA: Insufficient documentation

## 2019-06-13 DIAGNOSIS — Z79899 Other long term (current) drug therapy: Secondary | ICD-10-CM | POA: Insufficient documentation

## 2019-06-13 DIAGNOSIS — M5442 Lumbago with sciatica, left side: Secondary | ICD-10-CM | POA: Insufficient documentation

## 2019-06-13 DIAGNOSIS — Z7982 Long term (current) use of aspirin: Secondary | ICD-10-CM | POA: Insufficient documentation

## 2019-06-13 DIAGNOSIS — M5432 Sciatica, left side: Secondary | ICD-10-CM

## 2019-06-13 DIAGNOSIS — M545 Low back pain: Secondary | ICD-10-CM | POA: Diagnosis present

## 2019-06-13 DIAGNOSIS — Z8546 Personal history of malignant neoplasm of prostate: Secondary | ICD-10-CM | POA: Diagnosis not present

## 2019-06-13 MED ORDER — OXYCODONE-ACETAMINOPHEN 5-325 MG PO TABS
1.0000 | ORAL_TABLET | Freq: Once | ORAL | Status: AC
  Administered 2019-06-13: 1 via ORAL
  Filled 2019-06-13: qty 1

## 2019-06-13 MED ORDER — METHOCARBAMOL 500 MG PO TABS: 500.0000 mg | ORAL_TABLET | Freq: Four times a day (QID) | ORAL | 0 refills | Status: DC

## 2019-06-13 MED ORDER — ORPHENADRINE CITRATE 30 MG/ML IJ SOLN
60.0000 mg | Freq: Once | INTRAMUSCULAR | Status: AC
  Administered 2019-06-13: 60 mg via INTRAMUSCULAR
  Filled 2019-06-13: qty 2

## 2019-06-13 MED ORDER — KETOROLAC TROMETHAMINE 30 MG/ML IJ SOLN
30.0000 mg | Freq: Once | INTRAMUSCULAR | Status: AC
  Administered 2019-06-13: 30 mg via INTRAMUSCULAR
  Filled 2019-06-13: qty 1

## 2019-06-13 MED ORDER — HYDROCODONE-ACETAMINOPHEN 5-325 MG PO TABS: 1.0000 | ORAL_TABLET | ORAL | 0 refills | Status: DC | PRN

## 2019-06-13 MED ORDER — MELOXICAM 15 MG PO TABS: 15.0000 mg | ORAL_TABLET | Freq: Every day | ORAL | 0 refills | Status: DC

## 2019-06-13 NOTE — ED Notes (Signed)
Provider states he is okay with pt being discharged with BP of 165/103

## 2019-06-13 NOTE — ED Triage Notes (Signed)
C/O left lower back pain radiating down left leg x 1 week.

## 2019-06-13 NOTE — ED Provider Notes (Signed)
[facility] Emergency Department Provider Note  ____________________________________________  Time seen: Approximately 7:55 PM  I have reviewed the triage vital signs and the nursing notes.   HISTORY  Chief Complaint No chief complaint on file.    HPI Brian Luna is a 64 y.o. male who presents the emergency department with worsening lower back pain on the left side radiating down the left leg.  Patient does have history of bulging disc.  He states that they went to the beach recently, after sleeping on the horrible bending, he woke 1 morning with pain in the lower back.  Patient states that since then he has had worsening pain, and is now having pain, numbness and tingling running down the left leg.  No bowel or bladder function, saddle anesthesia, paresthesias.  Patient has tried over-the-counter medications which seem to help initially but are no longer providing significant relief.  Patient states that it has been aggravated by being at work over the past several days.  No other complaints at this time.         Past Medical History:  Diagnosis Date  . Blood in stool   . Chickenpox   . Depression   . GERD (gastroesophageal reflux disease)   . History of kidney stones   . Prostate cancer ([place])   . Varicose veins    With clot of varicose vein  . Vertigo     Patient Active Problem List   Diagnosis Date Noted  . Skin tag 10/22/2018  . Right knee pain 10/22/2018  . History of prostate cancer 10/19/2017  . Encounter for general adult medical examination with abnormal findings 08/04/2016  . GERD (gastroesophageal reflux disease) 06/01/2015  . Obesity 06/01/2015  . Elevated LDL cholesterol level 03/19/2015  . Chest pain 02/13/2015  . Vertigo 02/13/2015    Past Surgical History:  Procedure Laterality Date  . CYSTOSCOPY WITH LITHOLAPAXY N/A 06/24/2016   Procedure: CYSTOSCOPY WITH LITHOLAPAXY WITH HOLMIUM LASER;  Surgeon: Royston Cowper, MD;   Location: [facility] ORS;  Service: Urology;  Laterality: N/A;  . HOLMIUM LASER APPLICATION N/A 4/40/1027   Procedure: HOLMIUM LASER APPLICATION;  Surgeon: Royston Cowper, MD;  Location: [facility] ORS;  Service: Urology;  Laterality: N/A;  . MULTIPLE TOOTH EXTRACTIONS    . PROSTATE SURGERY    . Thrombosed hemorrhoids    . VARICOSE VEIN SURGERY      Prior to Admission medications   Medication Sig Start Date End Date Taking? Authorizing Provider  aspirin 81 MG tablet Take 81 mg by mouth daily.    [provider]  cyanocobalamin 2000 MCG tablet Take 2,000 mcg by mouth daily.    [provider]  esomeprazole (NEXIUM) 20 MG capsule Take 20 mg by mouth daily.     [provider]  HYDROcodone-acetaminophen (NORCO/VICODIN) 5-325 MG tablet Take 1 tablet by mouth every 4 (four) hours as needed for moderate pain. 06/13/19   Petro Talent, Charline Bills, PA-C  Magnesium 500 MG TABS Take 500 mg by mouth daily.    [provider]  meclizine (ANTIVERT) 25 MG tablet Take 0.5 tablets (12.5 mg total) by mouth 3 (three) times daily as needed (inner ear). 09/05/16   Leone Haven, MD  meloxicam (MOBIC) 15 MG tablet Take 1 tablet (15 mg total) by mouth daily. 06/13/19   Robbie Rideaux, Charline Bills, PA-C  methocarbamol (ROBAXIN) 500 MG tablet Take 1 tablet (500 mg total) by mouth 4 (four) times daily. 06/13/19   Pj Zehner, Charline Bills,  PA-C  naproxen sodium (ALEVE) 220 MG tablet Take 440 mg by mouth daily.    [provider]  niacin 500 MG tablet Take 500 mg by mouth daily.     [provider]  Potassium 99 MG TABS Take 99 mg by mouth daily.    [provider]  ranitidine (ZANTAC) 150 MG tablet Take 150 mg by mouth daily.     [provider]  rosuvastatin (CRESTOR) 20 MG tablet TAKE 1 TABLET BY MOUTH ONCE DAILY 07/20/18   Leone Haven, MD  tadalafil (CIALIS) 20 MG tablet Take 20 mg by mouth daily as needed for erectile dysfunction.    [provider]     Allergies Patient has no known allergies.  Family History  Problem Relation Age of Onset  . Arthritis Other   . Heart attack Father 67  . Heart attack Paternal Grandfather 53    Social History Social History   Tobacco Use  . Smoking status: Former Smoker    Packs/day: 1.50    Years: 40.00    Pack years: 60.00    Quit date: 06/19/2012    Years since quitting: 6.9  . Smokeless tobacco: Never Used  Substance Use Topics  . Alcohol use: Yes    Alcohol/week: 0.0 standard drinks    Comment: Occasional  . Drug use: No     Review of Systems  Constitutional: No fever/chills Eyes: No visual changes. No discharge ENT: No upper respiratory complaints. Cardiovascular: no chest pain. Respiratory: no cough. No SOB. Gastrointestinal: No abdominal pain.  No nausea, no vomiting.  No diarrhea.  No constipation. Musculoskeletal: Left lower back pain radiating down the left leg Skin: Negative for rash, abrasions, lacerations, ecchymosis. Neurological: Negative for headaches, focal weakness or numbness. 10-point ROS otherwise negative.  ____________________________________________   PHYSICAL EXAM:  VITAL SIGNS: ED Triage Vitals  Enc Vitals Group     BP 06/13/19 1716 (!) 159/98     Pulse Rate 06/13/19 1716 84     Resp 06/13/19 1716 17     Temp 06/13/19 1716 98.1 F (36.7 C)     Temp Source 06/13/19 1716 Oral     SpO2 06/13/19 1716 97 %     Weight 06/13/19 1712 205 lb 14.6 oz (93.4 kg)     Height 06/13/19 1718 5\' 7"  (1.702 m)     Head Circumference --      Peak Flow --      Pain Score 06/13/19 1712 8     Pain Loc --      Pain Edu? --      Excl. in [place]? --      Constitutional: Alert and oriented. Well appearing and in no acute distress. Eyes: Conjunctivae are normal. PERRL. EOMI. Head: Atraumatic. ENT:      Ears:       Nose: No congestion/rhinnorhea.      Mouth/Throat: Mucous membranes are moist.  Neck: No stridor.    Cardiovascular: Normal rate, regular rhythm.  Normal S1 and S2.  Good peripheral circulation. Respiratory: Normal respiratory effort without tachypnea or retractions. Lungs CTAB. Good air entry to the bases with no decreased or absent breath sounds. Gastrointestinal: Bowel sounds 4 quadrants. Soft and nontender to palpation. No guarding or rigidity. No palpable masses. No distention. No CVA tenderness. Musculoskeletal: Full range of motion to all extremities. No gross deformities appreciated.  Palpation midline lumbar spine region reveals no tenderness midline.  No palpable abnormalities.  Mild tenderness in the left paraspinal  muscle group extending into the SI joint and into the sciatic notch.  Negative straight leg raise.  Dorsalis pedis pulse intact bilateral lower extremities. Neurologic:  Normal speech and language. No gross focal neurologic deficits are appreciated.  Skin:  Skin is warm, dry and intact. No rash noted. Psychiatric: Mood and affect are normal. Speech and behavior are normal. Patient exhibits appropriate insight and judgement.   ____________________________________________   LABS (all labs ordered are listed, but only abnormal results are displayed)  Labs Reviewed - No data to display ____________________________________________  EKG   ____________________________________________  RADIOLOGY   No results found.  ____________________________________________    PROCEDURES  Procedure(s) performed:    Procedures    Medications  ketorolac (TORADOL) 30 MG/ML injection 30 mg (has no administration in time range)  orphenadrine (NORFLEX) injection 60 mg (has no administration in time range)  oxyCODONE-acetaminophen (PERCOCET/ROXICET) 5-325 MG per tablet 1 tablet (has no administration in time range)     ____________________________________________   INITIAL IMPRESSION / ASSESSMENT AND PLAN / ED COURSE  Pertinent labs & imaging results that were available during my care of the patient were reviewed  by me and considered in my medical decision making (see chart for details).  Review of the [place] CSRS was performed in accordance of the [place] prior to dispensing any controlled drugs.           Patient's diagnosis is consistent with sciatica.  Patient presented to emergency department with progressive pain radiating from the lower back down the left leg.  No concerning neuro symptoms.  Exam is reassuring.  Patient does have a history of a bulging disc but no recent trauma.  No indication for labs.  Patient will be treated with Toradol, Norflex, Norco here in the emergency department.  Patient was placed on anti-inflammatory, muscle relaxer and very limited course of pain medication.  Follow-up primary care as needed.  Return cautions discussed with the patient..  Patient is given ED precautions to return to the ED for any worsening or new symptoms.     ____________________________________________  FINAL CLINICAL IMPRESSION(S) / ED DIAGNOSES  Final diagnoses:  Sciatica of left side      NEW MEDICATIONS STARTED DURING THIS VISIT:  ED Discharge Orders         Ordered    meloxicam (MOBIC) 15 MG tablet  Daily     06/13/19 2022    methocarbamol (ROBAXIN) 500 MG tablet  4 times daily     06/13/19 2022    HYDROcodone-acetaminophen (NORCO/VICODIN) 5-325 MG tablet  Every 4 hours PRN     06/13/19 2022              This chart was dictated using voice recognition software/Dragon. Despite best efforts to proofread, errors can occur which can change the meaning. Any change was purely unintentional.    Darletta Moll, PA-C 06/13/19 2023    Lilia Pro., MD 06/14/19 1114

## 2019-06-13 NOTE — ED Notes (Signed)
Pt states he has chronic back pain but for a week it has been real bad after waking up one day. Pt states no pain when laying, but when moving the pain comes. Pt states he has been seen by chiropractors with no relief.

## 2019-06-14 ENCOUNTER — Ambulatory Visit: Payer: BC Managed Care – PPO | Admitting: Internal Medicine

## 2019-06-20 ENCOUNTER — Encounter: Payer: Self-pay | Admitting: Family Medicine

## 2019-06-20 ENCOUNTER — Ambulatory Visit: Payer: BC Managed Care – PPO | Admitting: Family Medicine

## 2019-06-20 ENCOUNTER — Other Ambulatory Visit: Payer: Self-pay

## 2019-06-20 ENCOUNTER — Ambulatory Visit (INDEPENDENT_AMBULATORY_CARE_PROVIDER_SITE_OTHER): Payer: BC Managed Care – PPO

## 2019-06-20 VITALS — BP 150/90 | HR 101 | Temp 97.8°F | Ht 67.0 in | Wt 211.8 lb

## 2019-06-20 DIAGNOSIS — M5442 Lumbago with sciatica, left side: Secondary | ICD-10-CM | POA: Diagnosis not present

## 2019-06-20 DIAGNOSIS — I10 Essential (primary) hypertension: Secondary | ICD-10-CM | POA: Insufficient documentation

## 2019-06-20 DIAGNOSIS — R030 Elevated blood-pressure reading, without diagnosis of hypertension: Secondary | ICD-10-CM | POA: Diagnosis not present

## 2019-06-20 DIAGNOSIS — G8929 Other chronic pain: Secondary | ICD-10-CM | POA: Insufficient documentation

## 2019-06-20 DIAGNOSIS — E7800 Pure hypercholesterolemia, unspecified: Secondary | ICD-10-CM

## 2019-06-20 MED ORDER — PREDNISONE 20 MG PO TABS: 40.0000 mg | ORAL_TABLET | Freq: Every day | ORAL | 0 refills | Status: DC

## 2019-06-20 NOTE — Assessment & Plan Note (Signed)
Acute on chronic back pain.  Will start on prednisone.  Check x-ray today.  Discussed risk of difficulty sleeping, appetite increase, and agitation with the prednisone.  Discussed seeking medical attention if he develops weakness or incontinence issues.  Advised to contact us immediately if his pain worsens.

## 2019-06-20 NOTE — Assessment & Plan Note (Addendum)
Suspect related to his pain.  We will have him follow-up in 1 week for recheck.

## 2019-06-20 NOTE — Progress Notes (Signed)
Brian Rumps, MD Phone: [phone]  Brian Luna is a 64 y.o. male who presents today for f/u.  Hyperlipidemia: Taking Crestor.  No chest pain, right upper quadrant pain, or myalgias.  Elevated BP: Typically well controlled though has been elevated since his back started to bother him.  Acute on chronic low back pain: Patient has chronic intermittent issues with this.  Over the last couple of weeks he has been having left-sided low back pain with left-sided sciatica.  Radiates down his posterior left leg.  No injury.  The top of his left foot is occasionally numb.  No weakness.  No incontinence.  He was seen in the emergency department and was treated conservatively with anti-inflammatories as well as narcotic pain medicine and a muscle relaxer.  He notes these have not helped significantly.  They did not perform any imaging.  Social History   Tobacco Use  Smoking Status Former Smoker  . Packs/day: 1.50  . Years: 40.00  . Pack years: 60.00  . Quit date: 06/19/2012  . Years since quitting: 7.0  Smokeless Tobacco Never Used     ROS see history of present illness  Objective  Physical Exam Vitals:   06/20/19 1458 06/20/19 1513  BP: (!) 160/90 (!) 150/90  Pulse: (!) 101   Temp: 97.8 F (36.6 C)   SpO2: 99%     BP Readings from Last 3 Encounters:  06/20/19 (!) 150/90  06/13/19 (!) 165/103  10/22/18 140/80   Wt Readings from Last 3 Encounters:  06/20/19 211 lb 12.8 oz (96.1 kg)  06/13/19 205 lb (93 kg)  10/22/18 206 lb (93.4 kg)    Physical Exam Constitutional:      General: He is not in acute distress.    Appearance: He is not diaphoretic.  Cardiovascular:     Rate and Rhythm: Normal rate and regular rhythm.     Heart sounds: Normal heart sounds.  Pulmonary:     Effort: Pulmonary effort is normal.     Breath sounds: Normal breath sounds.  Musculoskeletal:     Comments: No midline spine tenderness, no midline spine step-off, no muscular back tenderness,  negative straight leg raise bilaterally  Skin:    General: Skin is warm and dry.  Neurological:     Mental Status: He is alert.     Comments: 5/5 strength bilateral quads, hamstrings, plantar flexion, and dorsiflexion, sensation light touch intact bilateral lower extremities, 2+ patellar reflexes      Assessment/Plan: Please see individual problem list.  Left-sided low back pain with left-sided sciatica Acute on chronic back pain.  Will start on prednisone.  Check x-ray today.  Discussed risk of difficulty sleeping, appetite increase, and agitation with the prednisone.  Discussed seeking medical attention if he develops weakness or incontinence issues.  Advised to contact us immediately if his pain worsens.  Elevated LDL cholesterol level Continue Crestor.  Elevated BP without diagnosis of hypertension Suspect related to his pain.  We will have him follow-up in 1 week for recheck.   Orders Placed This Encounter  Procedures  . DG Lumbar Spine Complete    Standing Status:   Future    Standing Expiration Date:   06/19/2020    Order Specific Question:   Reason for Exam (SYMPTOM  OR DIAGNOSIS REQUIRED)    Answer:   low back pain with left sided sciatica, occasional numbness dorsum left foot    Order Specific Question:   Preferred imaging location?    Answer:  [facility] Specific Question:   Radiology Contrast Protocol - do NOT remove file path    Answer:   [email or web address]    Meds ordered this encounter  Medications  . predniSONE (DELTASONE) 20 MG tablet    Sig: Take 2 tablets (40 mg total) by mouth daily with breakfast.    Dispense:  10 tablet    Refill:  0    This visit occurred during the SARS-CoV-2 public health emergency.  Safety protocols were in place, including screening questions prior to the visit, additional usage of staff PPE, and extensive cleaning of exam room while observing appropriate contact  time as indicated for disinfecting solutions.    Brian Rumps, MD [place]

## 2019-06-20 NOTE — Patient Instructions (Signed)
Nice to see you. We will get an x-ray and placed on prednisone. I will see you back in 1 week. If your pain worsens please let us know immediately.  If you develop weakness or bowel or bladder incontinence please seek medical attention in the emergency room.

## 2019-06-20 NOTE — Assessment & Plan Note (Signed)
Continue Crestor 

## 2019-06-24 ENCOUNTER — Other Ambulatory Visit: Payer: Self-pay

## 2019-06-27 ENCOUNTER — Ambulatory Visit: Payer: BC Managed Care – PPO | Admitting: Family Medicine

## 2019-06-27 ENCOUNTER — Other Ambulatory Visit: Payer: Self-pay

## 2019-06-27 ENCOUNTER — Encounter: Payer: Self-pay | Admitting: Family Medicine

## 2019-06-27 VITALS — BP 140/90 | HR 71 | Temp 97.5°F | Ht 67.0 in | Wt 216.0 lb

## 2019-06-27 DIAGNOSIS — I10 Essential (primary) hypertension: Secondary | ICD-10-CM

## 2019-06-27 DIAGNOSIS — F320 Major depressive disorder, single episode, mild: Secondary | ICD-10-CM | POA: Diagnosis not present

## 2019-06-27 DIAGNOSIS — R5383 Other fatigue: Secondary | ICD-10-CM

## 2019-06-27 DIAGNOSIS — M5442 Lumbago with sciatica, left side: Secondary | ICD-10-CM | POA: Diagnosis not present

## 2019-06-27 DIAGNOSIS — I872 Venous insufficiency (chronic) (peripheral): Secondary | ICD-10-CM | POA: Insufficient documentation

## 2019-06-27 DIAGNOSIS — M546 Pain in thoracic spine: Secondary | ICD-10-CM | POA: Insufficient documentation

## 2019-06-27 LAB — CBC WITH DIFFERENTIAL/PLATELET
Basophils Absolute: 0.1 10*3/uL (ref 0.0–0.1)
Basophils Relative: 1 % (ref 0.0–3.0)
Eosinophils Absolute: 0.8 10*3/uL — ABNORMAL HIGH (ref 0.0–0.7)
Eosinophils Relative: 6.4 % — ABNORMAL HIGH (ref 0.0–5.0)
HCT: 45.6 % (ref 39.0–52.0)
Hemoglobin: 15.6 g/dL (ref 13.0–17.0)
Lymphocytes Relative: 21.9 % (ref 12.0–46.0)
Lymphs Abs: 2.7 10*3/uL (ref 0.7–4.0)
MCHC: 34.3 g/dL (ref 30.0–36.0)
MCV: 93.9 fl (ref 78.0–100.0)
Monocytes Absolute: 1.1 10*3/uL — ABNORMAL HIGH (ref 0.1–1.0)
Monocytes Relative: 8.9 % (ref 3.0–12.0)
Neutro Abs: 7.7 10*3/uL (ref 1.4–7.7)
Neutrophils Relative %: 61.8 % (ref 43.0–77.0)
Platelets: 226 10*3/uL (ref 150.0–400.0)
RBC: 4.85 Mil/uL (ref 4.22–5.81)
RDW: 13.6 % (ref 11.5–15.5)
WBC: 12.4 10*3/uL — ABNORMAL HIGH (ref 4.0–10.5)

## 2019-06-27 LAB — COMPREHENSIVE METABOLIC PANEL
ALT: 29 U/L (ref 0–53)
AST: 21 U/L (ref 0–37)
Albumin: 4.4 g/dL (ref 3.5–5.2)
Alkaline Phosphatase: 70 U/L (ref 39–117)
BUN: 20 mg/dL (ref 6–23)
CO2: 30 mEq/L (ref 19–32)
Calcium: 9.8 mg/dL (ref 8.4–10.5)
Chloride: 101 mEq/L (ref 96–112)
Creatinine, Ser: 1.07 mg/dL (ref 0.40–1.50)
GFR: 69.83 mL/min (ref >60.00)
Glucose, Bld: 84 mg/dL (ref 70–99)
Potassium: 5.1 mEq/L (ref 3.5–5.1)
Sodium: 137 mEq/L (ref 135–145)
Total Bilirubin: 0.6 mg/dL (ref 0.2–1.2)
Total Protein: 6.7 g/dL (ref 6.0–8.3)

## 2019-06-27 LAB — TSH: TSH: 1.38 u[IU]/mL (ref 0.35–4.50)

## 2019-06-27 LAB — VITAMIN B12: Vitamin B-12: >1526 pg/mL — ABNORMAL HIGH (ref 211–911)

## 2019-06-27 MED ORDER — AMLODIPINE BESYLATE 5 MG PO TABS: 5.0000 mg | ORAL_TABLET | Freq: Every day | ORAL | 3 refills | Status: DC

## 2019-06-27 MED ORDER — METHOCARBAMOL 500 MG PO TABS: 500.0000 mg | ORAL_TABLET | Freq: Four times a day (QID) | ORAL | 0 refills | Status: DC

## 2019-06-27 MED ORDER — DULOXETINE HCL 30 MG PO CPEP
ORAL_CAPSULE | ORAL | 1 refills | Status: DC
Start: 2019-06-27 — End: 2020-04-16

## 2019-06-27 MED ORDER — PREDNISONE 10 MG PO TABS: ORAL_TABLET | ORAL | 0 refills | Status: AC

## 2019-06-27 NOTE — Assessment & Plan Note (Signed)
Related to vein stripping in the past.  This has been stable for some time.  He will monitor.

## 2019-06-27 NOTE — Assessment & Plan Note (Signed)
I suspect this is the cause of his fatigue and decreased energy levels.  We will treat with Cymbalta as that may help with his pain as well.  We will check lab work to rule out other causes of his fatigue.

## 2019-06-27 NOTE — Assessment & Plan Note (Signed)
Pain is likely affecting this though his BP has been elevated on multiple occasions recently.  We will start him on a low-dose of amlodipine and have him follow-up in 3 weeks.  To monitor his blood pressure and if it does not start to come down with the amlodipine he will let us know.

## 2019-06-27 NOTE — Patient Instructions (Signed)
Nice to see you. We can do a more prolonged course of prednisone. You can take the Robaxin as a muscle relaxer as well.  This may make you drowsy so please be careful and do not drive if you get drowsy. We are going to place you on Cymbalta to treat depression and your pain. I will refer you for physical therapy. We are going to get some labs today.

## 2019-06-27 NOTE — Assessment & Plan Note (Signed)
Continues to be an issue.  Prednisone was helpful though maybe not for long enough of course.  We will give him a more prolonged taper.  Will refer for physical therapy.  He can continue Robaxin as needed.  Discussed risk of drowsiness with this.  Follow-up in about 3 weeks.

## 2019-06-27 NOTE — Assessment & Plan Note (Signed)
Suspect muscular strain.  Has improved already.  We will see how he does with the prednisone and muscle relaxer that he is taking for his low back.

## 2019-06-27 NOTE — Progress Notes (Signed)
Tommi Rumps, MD Phone: [phone]  Brian Luna is a 64 y.o. male who presents today for follow-up.  Low back pain: This continues to be an issue.  The prednisone did help some though once he came off of it he started to have similar pain.  If he walks more than 200 feet his pain radiates down his left leg.  He gets some tingling over the posterior aspect of his thigh into his lateral lower leg and the dorsum of his left foot.  No numbness, weakness, or incontinence.  He has been taking hydrocodone as well for pain and Robaxin.  Hypertension: BP remains elevated.  Pain is likely playing a role in this.  No chest pain or shortness of breath.  Notes it is similarly elevated at home.  Depression: Patient notes some fatigue and decreased energy levels for the last 6 or so months.  He does not have interest in doing things that he used to.  He is only sleeping about 4 hours though that has been going on for longer.  Some anxiety.  No SI.  Chronic venous insufficiency: Related to having veins stripped in his left leg.  His left leg intermittently swells up if he is been on it for a long time.  Notes this has been going on for years.  It goes down to normal overnight.  Thoracic back pain: Patient notes he woke up this morning with muscle soreness and stiffness in his thoracic back.  Hurts more if he moves a little bit.  Has been improving since this morning.  Social History   Tobacco Use  Smoking Status Former Smoker  . Packs/day: 1.50  . Years: 40.00  . Pack years: 60.00  . Quit date: 06/19/2012  . Years since quitting: 7.0  Smokeless Tobacco Never Used     ROS see history of present illness  Objective  Physical Exam Vitals:   06/27/19 0811 06/27/19 0829  BP: (!) 160/90 140/90  Pulse: 71   Temp: (!) 97.5 F (36.4 C)   SpO2: 99%     BP Readings from Last 3 Encounters:  06/27/19 140/90  06/20/19 (!) 150/90  06/13/19 (!) 165/103   Wt Readings from Last 3 Encounters:    06/27/19 216 lb (98 kg)  06/20/19 211 lb 12.8 oz (96.1 kg)  06/13/19 205 lb (93 kg)    Physical Exam Constitutional:      General: He is not in acute distress.    Appearance: He is not diaphoretic.  Cardiovascular:     Rate and Rhythm: Normal rate and regular rhythm.     Heart sounds: Normal heart sounds.  Pulmonary:     Effort: Pulmonary effort is normal.     Breath sounds: Normal breath sounds.  Musculoskeletal:     Comments: Left lower leg slightly larger than the right lower leg, no midline spine tenderness, no midline spine step-off, no muscular back tenderness  Skin:    General: Skin is warm and dry.  Neurological:     Mental Status: He is alert.     Comments: 5/5 strength bilateral quads, hamstrings, plantar flexion, and dorsiflexion, sensation light touch intact bilateral lower extremities, 2+ patellar reflexes      Assessment/Plan: Please see individual problem list.  Left-sided low back pain with left-sided sciatica Continues to be an issue.  Prednisone was helpful though maybe not for long enough of course.  We will give him a more prolonged taper.  Will refer for physical therapy.  He  can continue Robaxin as needed.  Discussed risk of drowsiness with this.  Follow-up in about 3 weeks.  Hypertension Pain is likely affecting this though his BP has been elevated on multiple occasions recently.  We will start him on a low-dose of amlodipine and have him follow-up in 3 weeks.  To monitor his blood pressure and if it does not start to come down with the amlodipine he will let us know.  Depression, major, single episode, mild ([place]) I suspect this is the cause of his fatigue and decreased energy levels.  We will treat with Cymbalta as that may help with his pain as well.  We will check lab work to rule out other causes of his fatigue.  Chronic venous insufficiency Related to vein stripping in the past.  This has been stable for some time.  He will monitor.  Thoracic back  pain Suspect muscular strain.  Has improved already.  We will see how he does with the prednisone and muscle relaxer that he is taking for his low back.    Orders Placed This Encounter  Procedures  . Comp Met (CMET)  . CBC w/Diff  . TSH  . B12  . Ambulatory referral to Physical Therapy    Referral Priority:   Routine    Referral Type:   Physical Medicine    Referral Reason:   Specialty Services Required    Requested Specialty:   Physical Therapy    Number of Visits Requested:   1    Meds ordered this encounter  Medications  . predniSONE (DELTASONE) 10 MG tablet    Sig: Take 4 tablets (40 mg total) by mouth daily with breakfast for 4 days, THEN 3 tablets (30 mg total) daily with breakfast for 2 days, THEN 2 tablets (20 mg total) daily with breakfast for 2 days, THEN 1 tablet (10 mg total) daily with breakfast for 2 days.    Dispense:  28 tablet    Refill:  0  . DULoxetine (CYMBALTA) 30 MG capsule    Sig: Take 1 capsule (30 mg total) by mouth daily for 7 days, THEN 2 capsules (60 mg total) daily.    Dispense:  180 capsule    Refill:  1  . amLODipine (NORVASC) 5 MG tablet    Sig: Take 1 tablet (5 mg total) by mouth daily.    Dispense:  90 tablet    Refill:  3  . methocarbamol (ROBAXIN) 500 MG tablet    Sig: Take 1 tablet (500 mg total) by mouth 4 (four) times daily.    Dispense:  16 tablet    Refill:  0    This visit occurred during the SARS-CoV-2 public health emergency.  Safety protocols were in place, including screening questions prior to the visit, additional usage of staff PPE, and extensive cleaning of exam room while observing appropriate contact time as indicated for disinfecting solutions.    Tommi Rumps, MD [place]

## 2019-06-28 ENCOUNTER — Telehealth: Payer: Self-pay

## 2019-06-28 NOTE — Telephone Encounter (Signed)
-----   Message from Leone Haven, MD sent at 06/28/2019  1:26 PM EDT ----- Please let the patient know that his B12 is elevated.  Please see if he is taking a B12 supplement.  His white blood cell count is slightly elevated.  This can be elevated due to a number of different reasons including infection, allergies, or blood abnormalities.  I would like to recheck this and if still elevated consider further work-up.

## 2019-07-04 ENCOUNTER — Telehealth: Payer: Self-pay

## 2019-07-04 NOTE — Telephone Encounter (Signed)
-----   Message from Leone Haven, MD sent at 06/28/2019  1:26 PM EDT ----- Please let the patient know that his B12 is elevated.  Please see if he is taking a B12 supplement.  His white blood cell count is slightly elevated.  This can be elevated due to a number of different reasons including infection, allergies, or blood abnormalities.  I would like to recheck this and if still elevated consider further work-up.

## 2019-07-07 ENCOUNTER — Telehealth: Payer: Self-pay

## 2019-07-07 NOTE — Telephone Encounter (Signed)
-----   Message from Leone Haven, MD sent at 06/28/2019  1:26 PM EDT ----- Please let the patient know that his B12 is elevated.  Please see if he is taking a B12 supplement.  His white blood cell count is slightly elevated.  This can be elevated due to a number of different reasons including infection, allergies, or blood abnormalities.  I would like to recheck this and if still elevated consider further work-up.

## 2019-07-18 ENCOUNTER — Other Ambulatory Visit: Payer: Self-pay

## 2019-07-18 ENCOUNTER — Ambulatory Visit: Payer: BC Managed Care – PPO | Admitting: Family Medicine

## 2019-07-18 ENCOUNTER — Encounter: Payer: Self-pay | Admitting: Family Medicine

## 2019-07-18 VITALS — BP 130/80 | HR 97 | Temp 98.5°F | Ht 67.0 in | Wt 208.0 lb

## 2019-07-18 DIAGNOSIS — M5442 Lumbago with sciatica, left side: Secondary | ICD-10-CM | POA: Diagnosis not present

## 2019-07-18 DIAGNOSIS — D72829 Elevated white blood cell count, unspecified: Secondary | ICD-10-CM | POA: Diagnosis not present

## 2019-07-18 DIAGNOSIS — R7989 Other specified abnormal findings of blood chemistry: Secondary | ICD-10-CM | POA: Insufficient documentation

## 2019-07-18 DIAGNOSIS — R748 Abnormal levels of other serum enzymes: Secondary | ICD-10-CM

## 2019-07-18 DIAGNOSIS — F320 Major depressive disorder, single episode, mild: Secondary | ICD-10-CM | POA: Diagnosis not present

## 2019-07-18 NOTE — Progress Notes (Signed)
  Tommi Rumps, MD Phone: [phone]  Brian Luna is a 64 y.o. male who presents today for follow-up.  Sciatica: Patient notes this resolved with the prolonged course of steroids.  He is also been on Cymbalta.  He never ended up doing physical therapy.  He notes if he standing for 30 minutes to an hour his left foot will get a little nominal change position and this will improve.  Depression: Notes this has improved.  He is chilled out more.  Has been on the Cymbalta.  Minimal anxiety if any.  No SI.  Social History   Tobacco Use  Smoking Status Former Smoker  . Packs/day: 1.50  . Years: 40.00  . Pack years: 60.00  . Quit date: 06/19/2012  . Years since quitting: 7.0  Smokeless Tobacco Never Used     ROS see history of present illness  Objective  Physical Exam Vitals:   07/18/19 1624  BP: 130/80  Pulse: 97  Temp: 98.5 F (36.9 C)  SpO2: 97%    BP Readings from Last 3 Encounters:  07/18/19 130/80  06/27/19 140/90  06/20/19 (!) 150/90   Wt Readings from Last 3 Encounters:  07/18/19 208 lb (94.3 kg)  06/27/19 216 lb (98 kg)  06/20/19 211 lb 12.8 oz (96.1 kg)    Physical Exam Constitutional:      General: He is not in acute distress.    Appearance: He is not diaphoretic.  Cardiovascular:     Rate and Rhythm: Normal rate and regular rhythm.     Heart sounds: Normal heart sounds.  Pulmonary:     Effort: Pulmonary effort is normal.     Breath sounds: Normal breath sounds.  Skin:    General: Skin is warm and dry.  Neurological:     Mental Status: He is alert.     Comments: 5/5 strength bilateral quads, hamstrings, plantar flexion, and dorsiflexion, sensation light touch intact bilateral lower extremities      Assessment/Plan: Please see individual problem list.  Left-sided low back pain with left-sided sciatica Symptoms have resolved at this point.  He will monitor.  I did encourage him to do physical therapy to try to prevent this from happening in  the future though he declines that.  Depression, major, single episode, mild ([facility]) Improved.  Continue Cymbalta.  Leukocytosis Noted on labs.  Recheck in 2 weeks when he has been off prednisone for long enough.  Elevated vitamin B12 level Noted on labs.  He does take a B12 supplement.  I advised him to discontinue this for 2 weeks and then will recheck.   Orders Placed This Encounter  Procedures  . CBC with Differential/Platelet    Standing Status:   Future    Standing Expiration Date:   07/17/2020  . Vitamin B12    Standing Status:   Future    Standing Expiration Date:   07/17/2020    No orders of the defined types were placed in this encounter.   This visit occurred during the SARS-CoV-2 public health emergency.  Safety protocols were in place, including screening questions prior to the visit, additional usage of staff PPE, and extensive cleaning of exam room while observing appropriate contact time as indicated for disinfecting solutions.    Tommi Rumps, MD [place]

## 2019-07-18 NOTE — Assessment & Plan Note (Signed)
Noted on labs.  Recheck in 2 weeks when he has been off prednisone for long enough.

## 2019-07-18 NOTE — Assessment & Plan Note (Signed)
Symptoms have resolved at this point.  He will monitor.  I did encourage him to do physical therapy to try to prevent this from happening in the future though he declines that.

## 2019-07-18 NOTE — Assessment & Plan Note (Signed)
Improved.  Continue Cymbalta.

## 2019-07-18 NOTE — Assessment & Plan Note (Signed)
Noted on labs.  He does take a B12 supplement.  I advised him to discontinue this for 2 weeks and then will recheck.

## 2019-07-18 NOTE — Patient Instructions (Signed)
Nice to see you. Please stop your B12 supplement.  We will recheck labs in 2 weeks. Please continue the Cymbalta. Please let us know if you have recurrence of your sciatica.

## 2019-08-01 ENCOUNTER — Other Ambulatory Visit (INDEPENDENT_AMBULATORY_CARE_PROVIDER_SITE_OTHER): Payer: BC Managed Care – PPO

## 2019-08-01 ENCOUNTER — Other Ambulatory Visit: Payer: Self-pay

## 2019-08-01 DIAGNOSIS — R748 Abnormal levels of other serum enzymes: Secondary | ICD-10-CM

## 2019-08-01 DIAGNOSIS — D72829 Elevated white blood cell count, unspecified: Secondary | ICD-10-CM

## 2019-08-02 LAB — VITAMIN B12: Vitamin B-12: >1526 pg/mL — ABNORMAL HIGH (ref 211–911)

## 2019-08-02 LAB — CBC WITH DIFFERENTIAL/PLATELET
Basophils Absolute: 0.1 10*3/uL (ref 0.0–0.1)
Basophils Relative: 0.5 % (ref 0.0–3.0)
Eosinophils Absolute: 0.2 10*3/uL (ref 0.0–0.7)
Eosinophils Relative: 2.2 % (ref 0.0–5.0)
HCT: 42.8 % (ref 39.0–52.0)
Hemoglobin: 14.8 g/dL (ref 13.0–17.0)
Lymphocytes Relative: 23.4 % (ref 12.0–46.0)
Lymphs Abs: 2.5 10*3/uL (ref 0.7–4.0)
MCHC: 34.5 g/dL (ref 30.0–36.0)
MCV: 93 fl (ref 78.0–100.0)
Monocytes Absolute: 1.1 10*3/uL — ABNORMAL HIGH (ref 0.1–1.0)
Monocytes Relative: 10.6 % (ref 3.0–12.0)
Neutro Abs: 6.8 10*3/uL (ref 1.4–7.7)
Neutrophils Relative %: 63.3 % (ref 43.0–77.0)
Platelets: 221 10*3/uL (ref 150.0–400.0)
RBC: 4.6 Mil/uL (ref 4.22–5.81)
RDW: 13.6 % (ref 11.5–15.5)
WBC: 10.7 10*3/uL — ABNORMAL HIGH (ref 4.0–10.5)

## 2019-08-04 ENCOUNTER — Telehealth: Payer: Self-pay

## 2019-08-04 NOTE — Telephone Encounter (Signed)
-----   Message from Leone Haven, MD sent at 08/04/2019  1:04 PM EDT ----- Please let the patient know his B12 remains elevated.  Is he taking a B12 supplement?  His WBC is slightly elevated again.  Has he had any signs of infection recently?

## 2019-10-16 ENCOUNTER — Other Ambulatory Visit: Payer: Self-pay | Admitting: Family Medicine

## 2019-10-28 ENCOUNTER — Encounter: Payer: Self-pay | Admitting: Family Medicine

## 2019-10-28 ENCOUNTER — Other Ambulatory Visit: Payer: Self-pay

## 2019-10-28 ENCOUNTER — Ambulatory Visit (INDEPENDENT_AMBULATORY_CARE_PROVIDER_SITE_OTHER): Payer: BC Managed Care – PPO | Admitting: Family Medicine

## 2019-10-28 VITALS — BP 120/70 | HR 88 | Temp 98.7°F | Ht 67.0 in | Wt 214.4 lb

## 2019-10-28 DIAGNOSIS — E6609 Other obesity due to excess calories: Secondary | ICD-10-CM | POA: Diagnosis not present

## 2019-10-28 DIAGNOSIS — I10 Essential (primary) hypertension: Secondary | ICD-10-CM

## 2019-10-28 DIAGNOSIS — Z0000 Encounter for general adult medical examination without abnormal findings: Secondary | ICD-10-CM

## 2019-10-28 DIAGNOSIS — Z6833 Body mass index (BMI) 33.0-33.9, adult: Secondary | ICD-10-CM

## 2019-10-28 DIAGNOSIS — E7800 Pure hypercholesterolemia, unspecified: Secondary | ICD-10-CM | POA: Diagnosis not present

## 2019-10-28 NOTE — Progress Notes (Signed)
Tommi Rumps, MD Phone: [phone]  Brian Luna is a 64 y.o. male who presents today for CPE.  Diet: Meat, potatoes, green beans, cheese and yogurt, does drink quite a bit of diet [facility] Exercise: His work is active, no other exercise Colonoscopy: 12/19/2016 with no polyps, 10-year recall Prostate cancer screening: Follows with urology for history of prostate cancer Family history-  Prostate cancer: No  Colon cancer: No Vaccines-   Flu: Up-to-date  Tetanus: Up-to-date  Shingles: He will check with insurance and then get at the pharmacy  COVID19: Up-to-date HIV screening: Previously declined Hep C Screening: Up-to-date Tobacco use: Former smoker Alcohol use: 1 beverage per day Illicit Drug use: No Dentist: Yes Ophthalmology: Yes, notes he is due to see them Patient has chronic hearing issues and tinnitus.  He has yearly screening through his work.  Active Ambulatory Problems    Diagnosis Date Noted  . Chest pain 02/13/2015  . Vertigo 02/13/2015  . Elevated LDL cholesterol level 03/19/2015  . GERD (gastroesophageal reflux disease) 06/01/2015  . Obesity 06/01/2015  . Routine general medical examination at a health care facility 08/04/2016  . History of prostate cancer 10/19/2017  . Skin tag 10/22/2018  . Right knee pain 10/22/2018  . Left-sided low back pain with left-sided sciatica 06/20/2019  . Hypertension 06/20/2019  . Depression, major, single episode, mild ([place]) 06/27/2019  . Chronic venous insufficiency 06/27/2019  . Thoracic back pain 06/27/2019  . Leukocytosis 07/18/2019  . Elevated vitamin B12 level 07/18/2019   Resolved Ambulatory Problems    Diagnosis Date Noted  . No Resolved Ambulatory Problems   Past Medical History:  Diagnosis Date  . Blood in stool   . Chickenpox   . Depression   . History of kidney stones   . Prostate cancer ([place])   . Varicose veins     Family History  Problem Relation Age of Onset  . Arthritis Other   .  Heart attack Father 81  . Heart attack Paternal Grandfather 79    Social History   Socioeconomic History  . Marital status: Married    Spouse name: Not on file  . Number of children: Not on file  . Years of education: Not on file  . Highest education level: Not on file  Occupational History  . Not on file  Tobacco Use  . Smoking status: Former Smoker    Packs/day: 1.50    Years: 40.00    Pack years: 60.00    Quit date: 06/19/2012    Years since quitting: 7.3  . Smokeless tobacco: Never Used  Vaping Use  . Vaping Use: Never used  Substance and Sexual Activity  . Alcohol use: Yes    Alcohol/week: 0.0 standard drinks    Comment: Occasional  . Drug use: No  . Sexual activity: Not on file  Other Topics Concern  . Not on file  Social History Narrative  . Not on file   Social Determinants of Health   Financial Resource Strain:   . Difficulty of Paying Living Expenses: Not on file  Food Insecurity:   . Worried About Charity fundraiser in the Last Year: Not on file  . Ran Out of Food in the Last Year: Not on file  Transportation Needs:   . Lack of Transportation (Medical): Not on file  . Lack of Transportation (Non-Medical): Not on file  Physical Activity:   . Days of Exercise per Week: Not on file  . Minutes of Exercise per  Session: Not on file  Stress:   . Feeling of Stress : Not on file  Social Connections:   . Frequency of Communication with Friends and Family: Not on file  . Frequency of Social Gatherings with Friends and Family: Not on file  . Attends Religious Services: Not on file  . Active Member of Clubs or Organizations: Not on file  . Attends Archivist Meetings: Not on file  . Marital Status: Not on file  Intimate Partner Violence:   . Fear of Current or Ex-Partner: Not on file  . Emotionally Abused: Not on file  . Physically Abused: Not on file  . Sexually Abused: Not on file    ROS  General:  Negative for nexplained weight loss,  fever Skin: Negative for new or changing mole, sore that won't heal HEENT: Positive for chronic issues with trouble hearing, chronic issues with trouble seeing, chronic issues with ringing in ears, negative for mouth sores, hoarseness, change in voice, dysphagia. CV:  Negative for chest pain, dyspnea, edema, palpitations Resp: Negative for cough, dyspnea, hemoptysis GI: Negative for nausea, vomiting, diarrhea, constipation, abdominal pain, melena, hematochezia. GU: Negative for dysuria, incontinence, urinary hesitance, hematuria, vaginal or penile discharge, polyuria, sexual difficulty, lumps in testicle or breasts MSK: Negative for muscle cramps or aches, joint pain or swelling Neuro: Negative for headaches, weakness, numbness, dizziness, passing out/fainting Psych: Negative for depression, anxiety, memory problems  Objective  Physical Exam Vitals:   10/28/19 1536 10/28/19 1558  BP: 120/80 120/70  Pulse: 88   Temp: 98.7 F (37.1 C)   SpO2: 96%     BP Readings from Last 3 Encounters:  10/28/19 120/70  07/18/19 130/80  06/27/19 140/90   Wt Readings from Last 3 Encounters:  10/28/19 214 lb 6.4 oz (97.3 kg)  07/18/19 208 lb (94.3 kg)  06/27/19 216 lb (98 kg)    Physical Exam Constitutional:      General: He is not in acute distress.    Appearance: He is not diaphoretic.  HENT:     Head: Normocephalic and atraumatic.  Eyes:     Conjunctiva/sclera: Conjunctivae normal.     Pupils: Pupils are equal, round, and reactive to light.  Cardiovascular:     Rate and Rhythm: Normal rate and regular rhythm.     Heart sounds: Normal heart sounds.  Pulmonary:     Effort: Pulmonary effort is normal.     Breath sounds: Normal breath sounds.  Abdominal:     General: Bowel sounds are normal. There is no distension.     Palpations: Abdomen is soft.     Tenderness: There is no abdominal tenderness. There is no guarding or rebound.  Musculoskeletal:     Right lower leg: No edema.      Left lower leg: No edema.  Lymphadenopathy:     Cervical: No cervical adenopathy.  Skin:    General: Skin is warm and dry.  Neurological:     Mental Status: He is alert.  Psychiatric:        Mood and Affect: Mood normal.      Assessment/Plan:   Problem List Items Addressed This Visit    Elevated LDL cholesterol level   Relevant Orders   Lipid panel   Hypertension   Relevant Orders   Basic Metabolic Panel (BMET)   Obesity   Relevant Orders   HgB A1c   Routine general medical examination at a health care facility - Primary    Physical exam completed.  Encouraged healthy diet and exercise.  He will continue to see urology for his history of prostate cancer.  Colonoscopy is up-to-date.  I encouraged him to get the Shingrix vaccine.  Lab work as outlined below.         This visit occurred during the SARS-CoV-2 public health emergency.  Safety protocols were in place, including screening questions prior to the visit, additional usage of staff PPE, and extensive cleaning of exam room while observing appropriate contact time as indicated for disinfecting solutions.    Tommi Rumps, MD [place]

## 2019-10-28 NOTE — Patient Instructions (Signed)
Nice to see you. Please check with your insurance regarding the Shingrix vaccine.  If they cover it please get this to your pharmacy or our office. Please cut down on your diet sodas and increase your exercise outside of work. We will contact you with your lab results.

## 2019-10-28 NOTE — Assessment & Plan Note (Signed)
Physical exam completed.  Encouraged healthy diet and exercise.  He will continue to see urology for his history of prostate cancer.  Colonoscopy is up-to-date.  I encouraged him to get the Shingrix vaccine.  Lab work as outlined below.

## 2019-10-29 LAB — HEMOGLOBIN A1C
Hgb A1c MFr Bld: 4.9 % of total Hgb (ref <5.7)
Mean Plasma Glucose: 94 (calc)
eAG (mmol/L): 5.2 (calc)

## 2019-10-29 LAB — LIPID PANEL
Cholesterol: 124 mg/dL (ref <200)
HDL: 46 mg/dL (ref >=40)
LDL Cholesterol (Calc): 55 mg/dL (calc)
Non-HDL Cholesterol (Calc): 78 mg/dL (calc) (ref <130)
Total CHOL/HDL Ratio: 2.7 (calc) (ref <5.0)
Triglycerides: 142 mg/dL (ref <150)

## 2019-10-29 LAB — BASIC METABOLIC PANEL
BUN: 15 mg/dL (ref 7–25)
CO2: 27 mmol/L (ref 20–32)
Calcium: 9.5 mg/dL (ref 8.6–10.3)
Chloride: 107 mmol/L (ref 98–110)
Creat: 1.25 mg/dL (ref 0.70–1.25)
Glucose, Bld: 83 mg/dL (ref 65–99)
Potassium: 4.4 mmol/L (ref 3.5–5.3)
Sodium: 140 mmol/L (ref 135–146)

## 2020-03-17 ENCOUNTER — Other Ambulatory Visit: Payer: Self-pay | Admitting: Family Medicine

## 2020-04-14 ENCOUNTER — Other Ambulatory Visit: Payer: Self-pay | Admitting: Family Medicine

## 2020-04-27 ENCOUNTER — Ambulatory Visit: Payer: BC Managed Care – PPO | Admitting: Family Medicine

## 2020-04-27 ENCOUNTER — Ambulatory Visit (INDEPENDENT_AMBULATORY_CARE_PROVIDER_SITE_OTHER): Payer: BC Managed Care – PPO

## 2020-04-27 ENCOUNTER — Other Ambulatory Visit: Payer: Self-pay

## 2020-04-27 ENCOUNTER — Encounter: Payer: Self-pay | Admitting: Family Medicine

## 2020-04-27 VITALS — BP 130/80 | HR 86 | Temp 97.3°F | Ht 67.0 in | Wt 221.6 lb

## 2020-04-27 DIAGNOSIS — G9519 Other vascular myelopathies: Secondary | ICD-10-CM | POA: Diagnosis not present

## 2020-04-27 DIAGNOSIS — G8929 Other chronic pain: Secondary | ICD-10-CM

## 2020-04-27 DIAGNOSIS — E7800 Pure hypercholesterolemia, unspecified: Secondary | ICD-10-CM | POA: Diagnosis not present

## 2020-04-27 DIAGNOSIS — R200 Anesthesia of skin: Secondary | ICD-10-CM | POA: Insufficient documentation

## 2020-04-27 DIAGNOSIS — I739 Peripheral vascular disease, unspecified: Secondary | ICD-10-CM

## 2020-04-27 DIAGNOSIS — M5442 Lumbago with sciatica, left side: Secondary | ICD-10-CM

## 2020-04-27 DIAGNOSIS — I10 Essential (primary) hypertension: Secondary | ICD-10-CM

## 2020-04-27 DIAGNOSIS — M5441 Lumbago with sciatica, right side: Secondary | ICD-10-CM

## 2020-04-27 MED ORDER — PREDNISONE 20 MG PO TABS: 40.0000 mg | ORAL_TABLET | Freq: Every day | ORAL | 0 refills | Status: DC

## 2020-04-27 NOTE — Assessment & Plan Note (Addendum)
The patient continues to have issues with this.  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  We will try to get an MRI approved by his insurance given the concern for neurogenic claudication.  We will proceed with a another course of prednisone to see if that is beneficial.  Discussed risk of sleeping issues, hunger, and agitation with the prednisone.  Orbital x-ray to be completed given patient's history of working in a [place].  He does report prior issues with metal fragments ending up near his eyes though he reports he always got them removed by an ophthalmologist.

## 2020-04-27 NOTE — Patient Instructions (Signed)
Nice to see you. We are going to see if we can get an MRI approved.  We will also get ABIs to evaluate the blood flow in your legs. Please try the prednisone to see if this is beneficial for your back symptoms.

## 2020-04-27 NOTE — Assessment & Plan Note (Signed)
Continue Crestor 20 mg once daily. ?

## 2020-04-27 NOTE — Assessment & Plan Note (Signed)
This is related to how he sleeping.  Discussed frequent change of positions and try to sleep with a pillow between his arms to see if that is beneficial.

## 2020-04-27 NOTE — Progress Notes (Signed)
Tommi Rumps, MD Phone: [phone]  Brian Luna is a 65 y.o. male who presents today for f/u.  HYPERTENSION  Disease Monitoring  Home BP Monitoring not checking Chest pain- no    Dyspnea- no Medications  Compliance-  Taking amlodipine.   Edema- no  HYPERLIPIDEMIA Symptoms Chest pain on exertion:  no   Leg claudication:   See below Medications: Compliance- taking crestor Right upper quadrant pain- no  Muscle aches- no  Chronic back pain: Patient notes chronic low back pain.  This is worse when he is up moving around.  He notes no pain when he is sitting down.  Notes the pain radiates down the back of his legs bilaterally.  This gets worse as he walks.  No numbness, weakness, or incontinence.  He takes 2 Aleve before work with some benefit.  2-3 times over the last week or so he has had to take 800 mg of ibuprofen in the evening.  He has not done physical therapy for this.  The patient notes the prednisone that he was given last year helped significantly.  X-ray from 2021 with lumbar spine scoliosis and multilevel degenerative changes diffusely.  Hand numbness: Patient notes this only occurs when he sleeps.  If he changes position it goes away.  Left greater than right.    Social History   Tobacco Use  Smoking Status Former Smoker  . Packs/day: 1.50  . Years: 40.00  . Pack years: 60.00  . Quit date: 06/19/2012  . Years since quitting: 7.8  Smokeless Tobacco Never Used    Current Outpatient Medications on File Prior to Visit  Medication Sig Dispense Refill  . amLODipine (NORVASC) 5 MG tablet TAKE 1 TABLET BY MOUTH ONCE DAILY 90 tablet 1  . aspirin 81 MG tablet Take 81 mg by mouth daily.    . cyanocobalamin 2000 MCG tablet Take 2,000 mcg by mouth daily.    . DULoxetine (CYMBALTA) 30 MG capsule TAKE 1 CAPSULE BY MOUTH ONCE DAILY FOR 7DAYS THEN 2 CAPSULES DAILY 180 capsule 1  . esomeprazole (NEXIUM) 20 MG capsule Take 20 mg by mouth daily.     . Magnesium 500 MG TABS Take  500 mg by mouth daily.    . meclizine (ANTIVERT) 25 MG tablet Take 0.5 tablets (12.5 mg total) by mouth 3 (three) times daily as needed (inner ear). 30 tablet 0  . naproxen sodium (ALEVE) 220 MG tablet Take 440 mg by mouth daily.    . niacin 500 MG tablet Take 500 mg by mouth daily.     . Potassium 99 MG TABS Take 99 mg by mouth daily.    . rosuvastatin (CRESTOR) 20 MG tablet TAKE 1 TABLET BY MOUTH ONCE EVERY EVENING 90 tablet 3  . ranitidine (ZANTAC) 150 MG tablet Take 150 mg by mouth daily.  (Patient not taking: Reported on 04/27/2020)    . tadalafil (CIALIS) 20 MG tablet Take 20 mg by mouth daily as needed for erectile dysfunction. (Patient not taking: Reported on 04/27/2020)     No current facility-administered medications on file prior to visit.     ROS see history of present illness  Objective  Physical Exam Vitals:   04/27/20 1607  BP: 130/80  Pulse: 86  Temp: (!) 97.3 F (36.3 C)  SpO2: 96%    BP Readings from Last 3 Encounters:  04/27/20 130/80  10/28/19 120/70  07/18/19 130/80   Wt Readings from Last 3 Encounters:  04/27/20 221 lb 9.6 oz (100.5 kg)  10/28/19 214 lb 6.4 oz (97.3 kg)  07/18/19 208 lb (94.3 kg)    Physical Exam Constitutional:      General: He is not in acute distress.    Appearance: He is not diaphoretic.  Cardiovascular:     Rate and Rhythm: Normal rate and regular rhythm.     Heart sounds: Normal heart sounds.     Comments: Faintly palpable DP and PT pulses bilaterally Pulmonary:     Effort: Pulmonary effort is normal.     Breath sounds: Normal breath sounds.  Skin:    General: Skin is warm and dry.  Neurological:     Mental Status: He is alert.     Comments: 5/5 strength bilateral quads, hamstrings, plantar flexion, and dorsiflexion, and grip, sensation to light touch intact bilateral lower extremities and hands, 2+ patellar reflexes, negative straight leg raise bilaterally      Assessment/Plan: Please see individual problem  list.  Problem List Items Addressed This Visit    Elevated LDL cholesterol level    Continue Crestor 20 mg once daily.      Chronic low back pain    The patient continues to have issues with this.  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  We will try to get an MRI approved by his insurance given the concern for neurogenic claudication.  We will proceed with a another course of prednisone to see if that is beneficial.  Discussed risk of sleeping issues, hunger, and agitation with the prednisone.  Orbital x-ray to be completed given patient's history of working in a [employer].  He does report prior issues with metal fragments ending up near his eyes though he reports he always got them removed by an ophthalmologist.      Relevant Medications   predniSONE (DELTASONE) 20 MG tablet   Other Relevant Orders   MR Lumbar Spine Wo Contrast   Hypertension    Stable.  Continue amlodipine 5 mg once daily.      Neurogenic claudication ([place])    Concern for this based on his history.  We will get an MRI to evaluate this further.  We will also obtain ABIs to rule out vascular based claudication.      Relevant Medications   predniSONE (DELTASONE) 20 MG tablet   Other Relevant Orders   MR Lumbar Spine Wo Contrast   DG Eye Foreign Body   Positional hand numbness    This is related to how he sleeping.  Discussed frequent change of positions and try to sleep with a pillow between his arms to see if that is beneficial.       Other Visit Diagnoses    Intermittent claudication ([place])    -  Primary   Relevant Medications   predniSONE (DELTASONE) 20 MG tablet   Other Relevant Orders   US ARTERIAL ABI (SCREENING LOWER EXTREMITY)        This visit occurred during the SARS-CoV-2 public health emergency.  Safety protocols were in place, including screening questions prior to the visit, additional usage of staff PPE, and extensive cleaning of exam room while observing  appropriate contact time as indicated for disinfecting solutions.    Tommi Rumps, MD [place]

## 2020-04-27 NOTE — Assessment & Plan Note (Signed)
Concern for this based on his history.  We will get an MRI to evaluate this further.  We will also obtain ABIs to rule out vascular based claudication.

## 2020-04-27 NOTE — Assessment & Plan Note (Signed)
Stable  Continue amlodipine 5 mg once daily  

## 2020-05-11 ENCOUNTER — Ambulatory Visit
Admission: RE | Admit: 2020-05-11 | Discharge: 2020-05-11 | Disposition: A | Discharge status: Home / Self Care | Payer: BC Managed Care – PPO | Source: Ambulatory Visit | Attending: Family Medicine | Admitting: Family Medicine

## 2020-05-11 ENCOUNTER — Other Ambulatory Visit: Payer: Self-pay

## 2020-05-11 DIAGNOSIS — G9519 Other vascular myelopathies: Secondary | ICD-10-CM | POA: Insufficient documentation

## 2020-05-11 DIAGNOSIS — G8929 Other chronic pain: Secondary | ICD-10-CM

## 2020-05-11 DIAGNOSIS — M5441 Lumbago with sciatica, right side: Secondary | ICD-10-CM | POA: Insufficient documentation

## 2020-05-11 DIAGNOSIS — M5442 Lumbago with sciatica, left side: Secondary | ICD-10-CM | POA: Diagnosis present

## 2020-05-11 DIAGNOSIS — I739 Peripheral vascular disease, unspecified: Secondary | ICD-10-CM | POA: Diagnosis present

## 2020-05-12 ENCOUNTER — Other Ambulatory Visit: Payer: Self-pay | Admitting: Family Medicine

## 2020-05-16 ENCOUNTER — Other Ambulatory Visit: Payer: Self-pay | Admitting: Family Medicine

## 2020-05-16 DIAGNOSIS — M5416 Radiculopathy, lumbar region: Secondary | ICD-10-CM

## 2020-07-14 ENCOUNTER — Other Ambulatory Visit: Payer: Self-pay | Admitting: Family Medicine

## 2020-07-25 ENCOUNTER — Ambulatory Visit: Payer: BC Managed Care – PPO | Attending: Pain Medicine | Admitting: Pain Medicine

## 2020-07-25 ENCOUNTER — Encounter: Payer: Self-pay | Admitting: Pain Medicine

## 2020-07-25 ENCOUNTER — Other Ambulatory Visit: Payer: Self-pay

## 2020-07-25 VITALS — BP 141/89 | HR 78 | Temp 97.9°F | Ht 67.0 in | Wt 205.0 lb

## 2020-07-25 DIAGNOSIS — R208 Other disturbances of skin sensation: Secondary | ICD-10-CM | POA: Diagnosis not present

## 2020-07-25 DIAGNOSIS — M79605 Pain in left leg: Secondary | ICD-10-CM | POA: Diagnosis not present

## 2020-07-25 DIAGNOSIS — R200 Anesthesia of skin: Secondary | ICD-10-CM | POA: Insufficient documentation

## 2020-07-25 DIAGNOSIS — M899 Disorder of bone, unspecified: Secondary | ICD-10-CM | POA: Insufficient documentation

## 2020-07-25 DIAGNOSIS — F419 Anxiety disorder, unspecified: Secondary | ICD-10-CM | POA: Diagnosis present

## 2020-07-25 DIAGNOSIS — M47816 Spondylosis without myelopathy or radiculopathy, lumbar region: Secondary | ICD-10-CM | POA: Insufficient documentation

## 2020-07-25 DIAGNOSIS — M4186 Other forms of scoliosis, lumbar region: Secondary | ICD-10-CM | POA: Insufficient documentation

## 2020-07-25 DIAGNOSIS — Z789 Other specified health status: Secondary | ICD-10-CM | POA: Diagnosis present

## 2020-07-25 DIAGNOSIS — Z79899 Other long term (current) drug therapy: Secondary | ICD-10-CM | POA: Diagnosis present

## 2020-07-25 DIAGNOSIS — M47817 Spondylosis without myelopathy or radiculopathy, lumbosacral region: Secondary | ICD-10-CM | POA: Insufficient documentation

## 2020-07-25 DIAGNOSIS — G894 Chronic pain syndrome: Secondary | ICD-10-CM | POA: Diagnosis not present

## 2020-07-25 DIAGNOSIS — M5137 Other intervertebral disc degeneration, lumbosacral region: Secondary | ICD-10-CM | POA: Insufficient documentation

## 2020-07-25 DIAGNOSIS — G8929 Other chronic pain: Secondary | ICD-10-CM | POA: Insufficient documentation

## 2020-07-25 DIAGNOSIS — M5126 Other intervertebral disc displacement, lumbar region: Secondary | ICD-10-CM | POA: Diagnosis present

## 2020-07-25 DIAGNOSIS — M5442 Lumbago with sciatica, left side: Secondary | ICD-10-CM | POA: Insufficient documentation

## 2020-07-25 DIAGNOSIS — M48061 Spinal stenosis, lumbar region without neurogenic claudication: Secondary | ICD-10-CM | POA: Diagnosis present

## 2020-07-25 DIAGNOSIS — R937 Abnormal findings on diagnostic imaging of other parts of musculoskeletal system: Secondary | ICD-10-CM | POA: Diagnosis present

## 2020-07-25 MED ORDER — DIAZEPAM 10 MG PO TABS
10.0000 mg | ORAL_TABLET | ORAL | 0 refills | Status: DC
Start: 2020-07-25 — End: 2021-04-10

## 2020-07-25 NOTE — Progress Notes (Signed)
Patient: Brian Luna  Service Category: E/M  Provider: Gaspar Cola, MD  DOB: 02-09-1955  DOS: 07/25/2020  Referring Provider: Leone Haven, MD  MRN: [id number]  Setting: Ambulatory outpatient  PCP: Leone Haven, MD  Type: New Patient  Specialty: Interventional Pain Management    Location: Office  Delivery: Face-to-face     Primary Reason(s) for Visit: Encounter for initial evaluation of one or more chronic problems (new to examiner) potentially causing chronic pain, and posing a threat to normal musculoskeletal function. (Level of risk: High) CC: Hip Pain (both)  HPI  Mr. Wilmeth is a 65 y.o. year old, male patient, who comes for the first time to our practice referred by Leone Haven, MD for our initial evaluation of his chronic pain. 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); Hypertension; Depression, major, single episode, mild ([place]); Chronic venous insufficiency; Thoracic back pain; Leukocytosis; Elevated vitamin B12 level; Neurogenic claudication ([place]); Positional hand numbness; Chronic pain syndrome; Pharmacologic therapy; Disorder of skeletal system; Problems influencing health status; DDD (degenerative disc disease), lumbosacral; DJD (degenerative joint disease), lumbosacral; Chronic lower extremity pain (Left); Numbness of foot (intermittent) (Left); Abnormal MRI, lumbar spine (05/12/2020); Displacement of lumbar intervertebral disc; Foraminal stenosis of lumbar region (Right: L4-5, L5-S1) (Left: L4-5); 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 Today he comes in for evaluation of his Hip Pain (both)  Pain Assessment: Location: Left,  Right Hip Radiating: pain radiaties down back to his hips and calf, feet becomes numb Onset: More than a month ago Duration: Chronic pain Quality: Aching, Constant, Radiating, Numbness, Burning Severity: 2 /10 (subjective, self-reported pain score)  Effect on ADL: limits my daily activities Timing: Intermittent Modifying factors: elevate his feet, meds at times BP: (!) 141/89  HR: 78  Onset and Duration: Sudden and Present longer than 3 months4 yrs Cause of pain: Unknown Severity: Getting worse, NAS-11 at its worse: 10/10, NAS-11 at its best: 2/10, NAS-11 now: 2/10, and NAS-11 on the average: 7/10 Timing: Afternoon, Night, and After activity or exercise Aggravating Factors: Prolonged standing Alleviating Factors: Stretching, Medications, Sitting, and Warm showers or baths Associated Problems: Numbness, Tingling, and Pain that wakes patient up Quality of Pain: Aching, Annoying, Constant, Nagging, Sharp, Tingling, and Uncomfortable Previous Examinations or Tests: MRI scan and X-rays Previous Treatments: Steroid treatments by mouth and Stretching exercises  According to the patient the primary area of pain is that of the lower back/hips (Bilateral) (R>L).  He denies any prior surgeries or physical therapy.  He admits to having had some nerve blocks in the past (18 years ago) that according to his description appears to have been a series of 3 lumbar epidural steroid injections.  He describes that it helped and he was pain-free for 5 years after that.  He has a recent MRI of the lumbar spine that was done on 05/11/2020.  The results of this MRI were reviewed today with the patient and I did provide him with a copy of those results.  The patient's secondary area pain is that of the lower extremities (Bilateral) (R>L).  Indicates of the right lower extremity the pain goes down to the area of the calf through the posterior aspect of the leg.  He refers no  pain, numbness, or weakness below the knee  except for that calf region.  Today he is having no left lower extremity pain symptoms.  Physical exam today was positive for pain going down the back of the leg to the calf area upon performing a right-sided straight leg raise.  He also experienced reproduction of his bilateral low back pain with hyperextension on the lumbar spine.  Provocative hyperextension on rotation of the lumbar spine triggered ipsilateral low back pain with positive facet arthralgia, bilaterally, with the worst side being the right side.  He indicated that this pain identically reproduced his usual low back pain and leg pain.  Going towards the right side reproduce not only his low back pain but it also reproduce his referred lower extremity pain.  Kemp maneuver was positive bilaterally for bilateral lumbar facet joint pain/arthralgia.  Subsequent provocative Patrick maneuver was negative bilaterally for sacroiliac joint or facet pathology.  The patient commented today that the last time he had a flareup of this low back and lower extremity pain, he received a prescription from his primary care physician for steroids which did help his pain.  Unfortunately not all of it went away and he had to have a second round.  Although this seems to be effective, it did not last long.  Today he comes in looking for other alternatives.  Historic Controlled Substance Pharmacotherapy Review  PMP and historical list of controlled substances: Hydrocodone/APAP 5/325 (# 10) (06/14/2019) Current opioid analgesics: None MME/day: 0 mg/day  Historical Monitoring: The patient  reports no history of drug use. List of all UDS Test(s): No results found for: MDMA, COCAINSCRNUR, [place], [place], CANNABQUANT, THCU, [place] List of other Serum/Urine Drug Screening Test(s):  No results found for: AMPHSCRSER, BARBSCRSER, BENZOSCRSER, COCAINSCRSER, COCAINSCRNUR, PCPSCRSER, PCPQUANT, THCSCRSER, THCU, CANNABQUANT, OPIATESCRSER, OXYSCRSER, PROPOXSCRSER,  ETH Historical Background Evaluation: [place] PMP: PDMP reviewed during this encounter. Online review of the past 71-monthperiod conducted.             PMP NARX Score Report:  Narcotic: 020 Sedative: 010 Stimulant: 000 [place] Department of public safety, offender search: (Editor, commissioningInformation) Non-contributory Risk Assessment Profile: Aberrant behavior: None observed or detected today Risk factors for fatal opioid overdose: None identified today PMP NARX Overdose Risk Score: 110 Fatal overdose hazard ratio (HR): Calculation deferred Non-fatal overdose hazard ratio (HR): Calculation deferred Risk of opioid abuse or dependence: 0.7-3.0% with doses ? 36 MME/day and 6.1-26% with doses ? 120 MME/day. Substance use disorder (SUD) risk level: See below Personal History of Substance Abuse (SUD-Substance use disorder):  Alcohol: Negative  Illegal Drugs: Negative  Rx Drugs: Negative  ORT Risk Level calculation: Low Risk  Opioid Risk Tool - 07/25/20 1356       Family History of Substance Abuse   Alcohol Negative    Illegal Drugs Negative    Rx Drugs Negative      Personal History of Substance Abuse   Alcohol Negative    Illegal Drugs Negative    Rx Drugs Negative      Age   Age between 195-45years  No      History of Preadolescent Sexual Abuse   History of Preadolescent Sexual Abuse Negative or Male      Psychological Disease   Psychological Disease Negative    Depression Negative      Total Score   Opioid Risk Tool Scoring 0    Opioid Risk Interpretation Low Risk  ORT Scoring interpretation table:  Score <3 = Low Risk for SUD  Score between 4-7 = Moderate Risk for SUD  Score >8 = High Risk for Opioid Abuse   PHQ-2 Depression Scale:  Total score:    PHQ-2 Scoring interpretation table: (Score and probability of major depressive disorder)  Score 0 = No depression  Score 1 = 15.4% Probability  Score 2 = 21.1% Probability  Score 3 = 38.4% Probability  Score 4 = 45.5%  Probability  Score 5 = 56.4% Probability  Score 6 = 78.6% Probability   PHQ-9 Depression Scale:  Total score:    PHQ-9 Scoring interpretation table:  Score 0-4 = No depression  Score 5-9 = Mild depression  Score 10-14 = Moderate depression  Score 15-19 = Moderately severe depression  Score 20-27 = Severe depression (2.4 times higher risk of SUD and 2.89 times higher risk of overuse)   Pharmacologic Plan: As per protocol, I have not taken over any controlled substance management, pending the results of ordered tests and/or consults.            Initial impression: Pending review of available data and ordered tests.  Meds   Current Outpatient Medications:    amLODipine (NORVASC) 5 MG tablet, TAKE 1 TABLET BY MOUTH ONCE DAILY, Disp: 90 tablet, Rfl: 1   cyanocobalamin 2000 MCG tablet, Take 2,000 mcg by mouth daily., Disp: , Rfl:    diazepam (VALIUM) 10 MG tablet, Take 1 tablet (10 mg total) by mouth 60 (sixty) minutes before procedure for 1 dose. Take with a sip of water, on an empty stomach. Do not eat anything for 6 hours prior to procedure., Disp: 1 tablet, Rfl: 0   DULoxetine (CYMBALTA) 30 MG capsule, TAKE 1 CAPSULE BY MOUTH ONCE DAILY FOR 7DAYS THEN 2 CAPSULES DAILY, Disp: 180 capsule, Rfl: 1   esomeprazole (NEXIUM) 20 MG capsule, Take 20 mg by mouth daily. , Disp: , Rfl:    Magnesium 500 MG TABS, Take 500 mg by mouth daily., Disp: , Rfl:    meclizine (ANTIVERT) 25 MG tablet, TAKE 1/2 TABLET BY MOUTH 3 TIMES DAILY AS NEEDED, Disp: 30 tablet, Rfl: 0   naproxen sodium (ALEVE) 220 MG tablet, Take 440 mg by mouth daily., Disp: , Rfl:    niacin 500 MG tablet, Take 500 mg by mouth daily. , Disp: , Rfl:    Potassium 99 MG TABS, Take 99 mg by mouth daily., Disp: , Rfl:    rosuvastatin (CRESTOR) 20 MG tablet, TAKE 1 TABLET BY MOUTH ONCE EVERY EVENING, Disp: 90 tablet, Rfl: 3   aspirin 81 MG tablet, Take 81 mg by mouth daily., Disp: , Rfl:   Imaging Review  Lumbosacral Imaging: Lumbar MR wo  contrast: Results for orders placed during the hospital encounter of 05/11/20 MR Lumbar Spine Wo Contrast  Narrative CLINICAL DATA:  Lumbar radiculopathy without red flag symptoms. Neurogenic claudication  EXAM: MRI LUMBAR SPINE WITHOUT CONTRAST  TECHNIQUE: Multiplanar, multisequence MR imaging of the lumbar spine was performed. No intravenous contrast was administered.  COMPARISON:  None available  FINDINGS: Segmentation: 5 lumbar type vertebrae based on the available coverage.  Alignment:  Mild levocurvature  Vertebrae:  No fracture, evidence of discitis, or bone lesion.  Conus medullaris and cauda equina: Conus extends to the L1-2 level. Conus and cauda equina appear normal.  Paraspinal and other soft tissues: Bilateral renal cystic intensities.  Disc levels:  T12- L1: Unremarkable.  L1-L2: Disc narrowing and bulging. Negative facets. No neural compression  L2-L3: Disc narrowing and bulging. Negative facets. No neural compression.  L3-L4: Disc narrowing and bulging with right paracentral protrusion. Mild facet spurring. Mild to moderate right foraminal narrowing. Asymmetric right subarticular recess narrowing but no definite L4 compression  L4-L5: Disc narrowing and bulging. Degenerative facet spurring on both sides. High-grade left and more moderate right foraminal impingement. Patent spinal canal with mild-to-moderate right subarticular recess narrowing  L5-S1:Disc narrowing and bulging with degenerative facet spurring. Moderate bilateral foraminal narrowing  IMPRESSION: 1. Generalized lumbar disc and facet degeneration with mild scoliotic curvature. 2. Diffusely patent spinal canal. Mild to moderate right subarticular recess narrowing at L3-4 and L4-5. 3. Notable left foraminal impingement at L4-5. Moderate right foraminal narrowings at L4-5 and L5-S1.   Electronically Signed By: Monte Fantasia M.D. On: 05/12/2020 14:31  Lumbar DG (Complete)  4+V: Results for orders placed in visit on 06/20/19 DG Lumbar Spine Complete  Narrative CLINICAL DATA:  Low back pain. Left-sided sciatica. Occasional numbness left foot.  EXAM: LUMBAR SPINE - COMPLETE 4+ VIEW  COMPARISON:  MRI lumbar spine report 05/18/2001.  FINDINGS: Lumbar spine scoliosis concave right. Diffuse multilevel degenerative change with multilevel disc degeneration and endplate osteophyte formation. No acute bony abnormality identified. No evidence of fracture.  IMPRESSION: Lumbar spine scoliosis concave right. Diffuse multilevel degenerative change. No acute abnormality identified.   Electronically Signed By: Marcello Moores  Register On: 06/21/2019 06:01  Knee Imaging: Knee-R DG 4 views: Results for orders placed during the hospital encounter of 09/13/18 DG Knee Complete 4 Views Right  Narrative CLINICAL DATA:  Pain and swelling RIGHT knee for 1 week, no known injury, anterior pain distal to patella and medial knee pain  EXAM: RIGHT KNEE - COMPLETE 4+ VIEW  COMPARISON:  None  FINDINGS: Osseous mineralization low normal.  Joint spaces preserved.  No acute fracture, dislocation, or bone destruction.  No significant joint effusion.  Serpiginous soft tissue densities at medial aspect of RIGHT leg question venous varicosities.  IMPRESSION: No acute osseous abnormalities.  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   Electronically Signed By: Lavonia Dana M.D. On: 09/13/2018 17:13  Complexity Note: Imaging results reviewed. Results shared with Mr. Burmester, using Layman's terms.                        ROS  Cardiovascular: Daily Aspirin intake, High blood pressure, and Heart catheterization Pulmonary or Respiratory: Snoring  Neurological: No reported neurological signs or symptoms such as seizures, abnormal skin sensations, urinary and/or fecal incontinence, being born with an abnormal open spine and/or a tethered spinal  cord Psychological-Psychiatric: Depressed Gastrointestinal: Reflux or heatburn Genitourinary: No reported renal or genitourinary signs or symptoms such as difficulty voiding or producing urine, peeing blood, non-functioning kidney, kidney stones, difficulty emptying the bladder, difficulty controlling the flow of urine, or chronic kidney disease Hematological: Brusing easily Endocrine: No reported endocrine signs or symptoms such as high or low blood sugar, rapid heart rate due to high thyroid levels, obesity or weight gain due to slow thyroid or thyroid disease Rheumatologic: No reported rheumatological signs and symptoms such as fatigue, joint pain, tenderness, swelling, redness, heat, stiffness, decreased range of motion, with or without associated rash Musculoskeletal: Negative for myasthenia gravis, muscular dystrophy, multiple sclerosis or malignant hyperthermia Work History: Working full time  Allergies  Mr. Ryans has No Known Allergies.  Laboratory Chemistry Profile   Renal Lab Results  Component Value Date   BUN 15 10/28/2019   CREATININE 1.25 10/28/2019  BCR NOT APPLICABLE 20/94/7096   GFR 69.83 06/27/2019     Electrolytes Lab Results  Component Value Date   NA 140 10/28/2019   K 4.4 10/28/2019   CL 107 10/28/2019   CALCIUM 9.5 10/28/2019     Hepatic Lab Results  Component Value Date   AST 21 06/27/2019   ALT 29 06/27/2019   ALBUMIN 4.4 06/27/2019   ALKPHOS 70 06/27/2019     ID Lab Results  Component Value Date   HCVAB NON-REACTIVE 09/03/2016     Bone No results found for: VD25OH, GE366QH4TML, YY5035WS5, KC1275TZ0, 25OHVITD1, 25OHVITD2, 25OHVITD3, TESTOFREE, TESTOSTERONE   Endocrine Lab Results  Component Value Date   GLUCOSE 83 10/28/2019   HGBA1C 4.9 10/28/2019   TSH 1.38 06/27/2019     Neuropathy Lab Results  Component Value Date   VITAMINB12 >1526 (H) 08/01/2019   HGBA1C 4.9 10/28/2019     CNS No results found for: COLORCSF, APPEARCSF,  RBCCOUNTCSF, WBCCSF, POLYSCSF, LYMPHSCSF, EOSCSF, PROTEINCSF, GLUCCSF, JCVIRUS, CSFOLI, IGGCSF, LABACHR, ACETBL, LABACHR, ACETBL   Inflammation (CRP: Acute  ESR: Chronic) No results found for: CRP, ESRSEDRATE, LATICACIDVEN   Rheumatology No results found for: RF, ANA, LABURIC, URICUR, LYMEIGGIGMAB, LYMEABIGMQN, HLAB27   Coagulation Lab Results  Component Value Date   PLT 221.0 08/01/2019     Cardiovascular Lab Results  Component Value Date   HGB 14.8 08/01/2019   HCT 42.8 08/01/2019     Screening Lab Results  Component Value Date   HCVAB NON-REACTIVE 09/03/2016     Cancer No results found for: CEA, CA125, LABCA2   Allergens No results found for: ALMOND, APPLE, ASPARAGUS, AVOCADO, BANANA, BARLEY, BASIL, BAYLEAF, GREENBEAN, LIMABEAN, WHITEBEAN, BEEFIGE, REDBEET, BLUEBERRY, BROCCOLI, CABBAGE, MELON, CARROT, CASEIN, CASHEWNUT, CAULIFLOWER, CELERY     Note: Lab results reviewed.  [place]  Drug: Mr. Winchell  reports no history of drug use. Alcohol:  reports current alcohol use. Tobacco:  reports that he quit smoking about 8 years ago. His smoking use included cigarettes. He has a 60.00 pack-year smoking history. He has never used smokeless tobacco. Medical:  has a past medical history of Blood in stool, Chickenpox, Depression, GERD (gastroesophageal reflux disease), History of kidney stones, Prostate cancer ([place]), Varicose veins, and Vertigo. Family: family history includes Arthritis in an other family member; Heart attack (age of onset: 9) in his paternal grandfather; Heart attack (age of onset: 49) in his father.  Past Surgical History:  Procedure Laterality Date   CYSTOSCOPY WITH LITHOLAPAXY N/A 06/24/2016   Procedure: CYSTOSCOPY WITH LITHOLAPAXY WITH HOLMIUM LASER;  Surgeon: Royston Cowper, MD;  Location: [facility] ORS;  Service: Urology;  Laterality: N/A;   HOLMIUM LASER APPLICATION N/A 0/17/4944   Procedure: HOLMIUM LASER APPLICATION;  Surgeon: Royston Cowper, MD;  Location:  [facility] OR[facility];  Service: Urology;  Laterality: N/A;   MULTIPLE TOOTH EXTRACTIONS     PROSTATE SURGERY     Thrombosed hemorrhoids     VARICOSE VEIN SURGERY     Active Ambulatory Problems    Diagnosis Date Noted   Vertigo 02/13/2015   Elevated LDL cholesterol level 03/19/2015   GERD (gastroesophageal reflux disease) 06/01/2015   Obesity 06/01/2015   Routine general medical examination at a health care facility 08/04/2016   History of prostate cancer 10/19/2017   Skin tag 10/22/2018   Right knee pain 10/22/2018   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) 06/20/2019   Hypertension 06/20/2019   Depression, major, single episode, mild ([place]) 06/27/2019   Chronic venous insufficiency 06/27/2019  Thoracic back pain 06/27/2019   Leukocytosis 07/18/2019   Elevated vitamin B12 level 07/18/2019   Neurogenic claudication ([facility]) 04/27/2020   Positional hand numbness 04/27/2020   Chronic pain syndrome 07/25/2020   Pharmacologic therapy 07/25/2020   Disorder of skeletal system 07/25/2020   Problems influencing health status 07/25/2020   DDD (degenerative disc disease), lumbosacral 07/25/2020   DJD (degenerative joint disease), lumbosacral 07/25/2020   Chronic lower extremity pain (Left) 07/25/2020   Numbness of foot (intermittent) (Left) 07/25/2020   Abnormal MRI, lumbar spine (05/12/2020) 07/25/2020   Displacement of lumbar intervertebral disc 07/25/2020   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 In no apparent acute distress Vitals:   07/25/20 1347  BP: (!) 141/89  Pulse: 78  Temp: 97.9 F (36.6 C)  SpO2: 98%  Weight: 205 lb (93 kg)  Height: '5\' 7"'  (1.702 m)   BMI Assessment: Estimated body mass index is 32.11 kg/m as calculated from the following:   Height as of this encounter: '5\' 7"'  (1.702 m).   Weight as of this encounter: 205 lb (93 kg).  BMI interpretation table: BMI level Category Range association with higher incidence of chronic pain  <18 kg/m2 Underweight   18.5-24.9 kg/m2 Ideal body weight   25-29.9 kg/m2 Overweight Increased incidence by 20%  30-34.9 kg/m2 Obese (Class I) Increased incidence by 68%  35-39.9 kg/m2 Severe obesity (Class II) Increased incidence by 136%  >40 kg/m2 Extreme obesity (Class III) Increased incidence by 254%   Patient's current BMI Ideal Body weight  Body mass index is 32.11 kg/m. Ideal body weight: 66.1 kg (145 lb 11.6 oz) Adjusted ideal body weight: 76.9 kg (169 lb 6.9 oz)   BMI Readings from Last 4 Encounters:  07/25/20 32.11 kg/m  04/27/20 34.71 kg/m  10/28/19 33.58 kg/m  07/18/19 32.58 kg/m   Wt Readings from Last 4 Encounters:  07/25/20 205 lb (93 kg)  04/27/20 221 lb 9.6 oz (100.5 kg)  10/28/19 214 lb 6.4 oz (97.3 kg)  07/18/19 208 lb (94.3 kg)    Psych/Mental status: Alert, oriented x 3 (person, place, & time)       Eyes: PERLA Respiratory: No evidence of acute respiratory distress  Assessment  Primary Diagnosis & Pertinent Problem List: The primary encounter diagnosis was Chronic pain syndrome. 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  Visit Diagnosis (New problems to examiner): 1. Chronic pain syndrome   2. Chronic low back pain (Bilateral) w/  sciatica (Left)   3. Chronic lower extremity pain (Left)   4. Numbness of foot (intermittent) (Left)   5. DDD (degenerative disc disease), lumbosacral   6. DJD (degenerative joint disease), lumbosacral   7. Displacement of lumbar intervertebral disc   8. Levoscoliosis of lumbar spine   9. Lumbar facet arthropathy (Multilevel) (Bilateral)   10. 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 Abnormal MRI, lumbar spine (05/12/2020)   13. Pharmacologic therapy   14. Disorder of skeletal system   15. Problems influencing health status   16. Anxiety due to invasive procedure    Plan of Care (Initial workup plan)  Note: Mr. Kozlov was reminded that as per protocol, today's visit has been an evaluation only. We have not taken over the patient's controlled substance management.  Problem-specific plan: No problem-specific Assessment & Plan notes found for this encounter. Lab Orders  Compliance Drug Analysis, Ur  Comp. Metabolic Panel (12)  Magnesium  Vitamin B12  Sedimentation rate  25-Hydroxy vitamin D Lcms D2+D3  C-reactive protein   Imaging Orders  No imaging studies ordered today   Referral Orders  No referral(s) requested today   Procedure Orders  LUMBAR FACET(MEDIAL BRANCH NERVE BLOCK) MBNB   Pharmacotherapy (current): Medications ordered:  Meds ordered this encounter  Medications   diazepam (VALIUM) 10 MG tablet    Sig: Take 1 tablet (10 mg total) by mouth 60  (sixty) minutes before procedure for 1 dose. Take with a sip of water, on an empty stomach. Do not eat anything for 6 hours prior to procedure.    Dispense:  1 tablet    Refill:  0    Medications administered during this visit: [place] P. Demir had no medications administered during this visit.   Pharmacological management options:  Opioid Analgesics: The patient was informed that there is no guarantee that he would be a candidate for opioid analgesics. The decision will be made following CDC guidelines. This decision will be based on the results of diagnostic studies, as well as Mr. Cowgill risk profile.   Membrane stabilizer: To be determined at a later time  Muscle relaxant: To be determined at a later time  NSAID: To be determined at a later time  Other analgesic(s): To be determined at a later time   Interventional management options: Mr. Kamer was informed that there is no guarantee that he would be a candidate for interventional therapies. The decision will be based on the results of diagnostic studies, as well as Mr. Payes risk profile.  Procedure(s) under consideration:  Pending results of ordered studies    Interventional Therapies  Risk  Complexity Considerations:   Estimated body mass index is 32.11 kg/m as calculated from the following:   Height as of this encounter: '5\' 7"'  (1.702 m).   Weight as of this encounter: 205 lb (93 kg). WNL   Planned  Pending:   Diagnostic bilateral lumbar facet block #1    Under consideration:   Diagnostic bilateral lumbar facet block #1    Completed:   None at this time   Therapeutic  Palliative (PRN) options:   None established   Provider-requested follow-up: Return for Procedure (no sedation): (B) L-FCT BLK #1.  Future Appointments  Date Time Provider [place]  07/31/2020  8:40 AM Milinda Pointer, MD [facility] None  10/10/2020  4:00 PM Leone Haven, MD [facility]    Note by:  Gaspar Cola,  MD Date: 07/25/2020; Time: 4:31 PM

## 2020-07-25 NOTE — Progress Notes (Signed)
Safety precautions to be maintained throughout the outpatient stay will include: orient to surroundings, keep bed in low position, maintain call bell within reach at all times, provide assistance with transfer out of bed and ambulation.  

## 2020-07-25 NOTE — Patient Instructions (Signed)
______________________________________________________________________  Preparing for your procedure (without sedation)  Procedure appointments are limited to planned procedures: No Prescription Refills. No disability issues will be discussed. No medication changes will be discussed.  Instructions: Oral Intake: Do not eat or drink anything for at least 6 hours prior to your procedure. (Exception: Blood Pressure Medication. See below.) Transportation: Unless otherwise stated by your physician, you may drive yourself after the procedure. Blood Pressure Medicine: Do not forget to take your blood pressure medicine with a sip of water the morning of the procedure. If your Diastolic (lower reading)is above 100 mmHg, elective cases will be cancelled/rescheduled. Blood thinners: These will need to be stopped for procedures. Notify our staff if you are taking any blood thinners. Depending on which one you take, there will be specific instructions on how and when to stop it. Diabetics on insulin: Notify the staff so that you can be scheduled 1st case in the morning. If your diabetes requires high dose insulin, take only  of your normal insulin dose the morning of the procedure and notify the staff that you have done so. Preventing infections: Shower with an antibacterial soap the morning of your procedure.  Build-up your immune system: Take 1000 mg of Vitamin C with every meal (3 times a day) the day prior to your procedure. Antibiotics: Inform the staff if you have a condition or reason that requires you to take antibiotics before dental procedures. Pregnancy: If you are pregnant, call and cancel the procedure. Sickness: If you have a cold, fever, or any active infections, call and cancel the procedure. Arrival: You must be in the facility at least 30 minutes prior to your scheduled procedure. Children: Do not bring any children with you. Dress appropriately: Bring dark clothing that you would not mind  if they get stained. Valuables: Do not bring any jewelry or valuables.  Reasons to call and reschedule or cancel your procedure: (Following these recommendations will minimize the risk of a serious complication.) Surgeries: Avoid having procedures within 2 weeks of any surgery. (Avoid for 2 weeks before or after any surgery). Flu Shots: Avoid having procedures within 2 weeks of a flu shots or . (Avoid for 2 weeks before or after immunizations). Barium: Avoid having a procedure within 7-10 days after having had a radiological study involving the use of radiological contrast. (Myelograms, Barium swallow or enema study). Heart attacks: Avoid any elective procedures or surgeries for the initial 6 months after a "Myocardial Infarction" (Heart Attack). Blood thinners: It is imperative that you stop these medications before procedures. Let us know if you if you take any blood thinner.  Infection: Avoid procedures during or within two weeks of an infection (including chest colds or gastrointestinal problems). Symptoms associated with infections include: Localized redness, fever, chills, night sweats or profuse sweating, burning sensation when voiding, cough, congestion, stuffiness, runny nose, sore throat, diarrhea, nausea, vomiting, cold or Flu symptoms, recent or current infections. It is specially important if the infection is over the area that we intend to treat. Heart and lung problems: Symptoms that may suggest an active cardiopulmonary problem include: cough, chest pain, breathing difficulties or shortness of breath, dizziness, ankle swelling, uncontrolled high or unusually low blood pressure, and/or palpitations. If you are experiencing any of these symptoms, cancel your procedure and contact your primary care physician for an evaluation.  Remember:  Regular Business hours are:  Monday to Thursday 8:00 AM to 4:00 PM  Provider's Schedule: Dora Simeone, MD:  Procedure days: Tuesday and Thursday    7:30 AM to 4:00 PM  Bilal Lateef, MD:  Procedure days: Monday and Wednesday 7:30 AM to 4:00 PM ______________________________________________________________________  ____________________________________________________________________________________________  General Risks and Possible Complications  Patient Responsibilities: It is important that you read this as it is part of your informed consent. It is our duty to inform you of the risks and possible complications associated with treatments offered to you. It is your responsibility as a patient to read this and to ask questions about anything that is not clear or that you believe was not covered in this document.  Patient's Rights: You have the right to refuse treatment. You also have the right to change your mind, even after initially having agreed to have the treatment done. However, under this last option, if you wait until the last second to change your mind, you may be charged for the materials used up to that point.  Introduction: Medicine is not an exact science. Everything in Medicine, including the lack of treatment(s), carries the potential for danger, harm, or loss (which is by definition: Risk). In Medicine, a complication is a secondary problem, condition, or disease that can aggravate an already existing one. All treatments carry the risk of possible complications. The fact that a side effects or complications occurs, does not imply that the treatment was conducted incorrectly. It must be clearly understood that these can happen even when everything is done following the highest safety standards.  No treatment: You can choose not to proceed with the proposed treatment alternative. The "PRO(s)" would include: avoiding the risk of complications associated with the therapy. The "CON(s)" would include: not getting any of the treatment benefits. These benefits fall under one of three categories: diagnostic; therapeutic; and/or palliative.  Diagnostic benefits include: getting information which can ultimately lead to improvement of the disease or symptom(s). Therapeutic benefits are those associated with the successful treatment of the disease. Finally, palliative benefits are those related to the decrease of the primary symptoms, without necessarily curing the condition (example: decreasing the pain from a flare-up of a chronic condition, such as incurable terminal cancer).  General Risks and Complications: These are associated to most interventional treatments. They can occur alone, or in combination. They fall under one of the following six (6) categories: no benefit or worsening of symptoms; bleeding; infection; nerve damage; allergic reactions; and/or death. No benefits or worsening of symptoms: In Medicine there are no guarantees, only probabilities. No healthcare provider can ever guarantee that a medical treatment will work, they can only state the probability that it may. Furthermore, there is always the possibility that the condition may worsen, either directly, or indirectly, as a consequence of the treatment. Bleeding: This is more common if the patient is taking a blood thinner, either prescription or over the counter (example: Goody Powders, Fish oil, Aspirin, Garlic, etc.), or if suffering a condition associated with impaired coagulation (example: Hemophilia, cirrhosis of the liver, low platelet counts, etc.). However, even if you do not have one on these, it can still happen. If you have any of these conditions, or take one of these drugs, make sure to notify your treating physician. Infection: This is more common in patients with a compromised immune system, either due to disease (example: diabetes, cancer, human immunodeficiency virus [HIV], etc.), or due to medications or treatments (example: therapies used to treat cancer and rheumatological diseases). However, even if you do not have one on these, it can still happen. If you  have any of these conditions, or take   one of these drugs, make sure to notify your treating physician. Nerve Damage: This is more common when the treatment is an invasive one, but it can also happen with the use of medications, such as those used in the treatment of cancer. The damage can occur to small secondary nerves, or to large primary ones, such as those in the spinal cord and brain. This damage may be temporary or permanent and it may lead to impairments that can range from temporary numbness to permanent paralysis and/or brain death. Allergic Reactions: Any time a substance or material comes in contact with our body, there is the possibility of an allergic reaction. These can range from a mild skin rash (contact dermatitis) to a severe systemic reaction (anaphylactic reaction), which can result in death. Death: In general, any medical intervention can result in death, most of the time due to an unforeseen complication. ____________________________________________________________________________________________  

## 2020-07-27 ENCOUNTER — Ambulatory Visit: Payer: BC Managed Care – PPO | Admitting: Family Medicine

## 2020-07-28 LAB — COMPLIANCE DRUG ANALYSIS, UR

## 2020-07-30 LAB — COMP. METABOLIC PANEL (12)
AST: 32 IU/L (ref 0–40)
Albumin/Globulin Ratio: 2 (ref 1.2–2.2)
Albumin: 4.5 g/dL (ref 3.8–4.8)
Alkaline Phosphatase: 105 IU/L (ref 44–121)
BUN/Creatinine Ratio: 13 (ref 10–24)
BUN: 12 mg/dL (ref 8–27)
Bilirubin Total: 0.6 mg/dL (ref 0.0–1.2)
Calcium: 10.1 mg/dL (ref 8.6–10.2)
Chloride: 103 mmol/L (ref 96–106)
Creatinine, Ser: 0.95 mg/dL (ref 0.76–1.27)
Globulin, Total: 2.2 g/dL (ref 1.5–4.5)
Glucose: 84 mg/dL (ref 65–99)
Potassium: 4.9 mmol/L (ref 3.5–5.2)
Sodium: 142 mmol/L (ref 134–144)
Total Protein: 6.7 g/dL (ref 6.0–8.5)
eGFR: 90 mL/min/{1.73_m2} (ref >59)

## 2020-07-30 LAB — VITAMIN B12: Vitamin B-12: >2000 pg/mL — ABNORMAL HIGH (ref 232–1245)

## 2020-07-30 LAB — MAGNESIUM: Magnesium: 2.3 mg/dL (ref 1.6–2.3)

## 2020-07-30 LAB — C-REACTIVE PROTEIN: CRP: 2 mg/L (ref 0–10)

## 2020-07-30 LAB — 25-HYDROXY VITAMIN D LCMS D2+D3
25-Hydroxy, Vitamin D-2: <1 ng/mL
25-Hydroxy, Vitamin D-3: 56 ng/mL
25-Hydroxy, Vitamin D: 56 ng/mL

## 2020-07-30 LAB — SEDIMENTATION RATE: Sed Rate: 9 mm/hr (ref 0–30)

## 2020-07-30 NOTE — Progress Notes (Signed)
PROVIDER NOTE: Information contained herein reflects review and annotations entered in association with encounter. Interpretation of such information and data should be left to medically-trained personnel. Information provided to patient can be located elsewhere in the medical record under "Patient Instructions". Document created using STT-dictation technology, any transcriptional errors that may result from process are unintentional.    Patient: Brian Luna  Service Category: Procedure  Provider: Gaspar Cola, MD  DOB: Sep 14, 1955  DOS: 07/31/2020  Location: [facility]  MRN: [id number]  Setting: Ambulatory - outpatient  Referring Provider: Leone Haven, MD  Type: Established Patient  Specialty: Interventional Pain Management  PCP: Leone Haven, MD   Primary Reason for Visit: Interventional Pain Management Treatment. CC: Back Pain (lower)  Procedure:          Anesthesia, Analgesia, Anxiolysis:  Type: Lumbar Facet, Medial Branch Block(s)          Primary Purpose: Diagnostic Region: Posterolateral Lumbosacral Spine Level: L2, L3, L4, L5, & S1 Medial Branch Level(s). Injecting these levels blocks the L3-4, L4-5, and L5-S1 lumbar facet joints. Laterality: Bilateral  Type: Local Anesthesia Indication(s): Analgesia         Route: Infiltration (Lyman/IM) IV Access: Declined Sedation: Declined  Local Anesthetic: Lidocaine 1-2%  Position: Prone   Indications: 1. Lumbar facet joint syndrome (Bilateral)   2. Lumbar facet arthropathy (Multilevel) (Bilateral)   3. Spondylosis without myelopathy or radiculopathy, lumbosacral region   4. DDD (degenerative disc disease), lumbosacral   5. DJD (degenerative joint disease), lumbosacral   6. Chronic low back pain (Bilateral) w/o sciatica   7. Chronic hip pain (2ry area of Pain) (Bilateral)   8. Chronic lower extremity pain (3ry area of Pain) (Bilateral)    Pain Score: Pre-procedure: 3 /10 Post-procedure: 0-No pain/10    Pre-op H&P Assessment:  Brian Luna is a 65 y.o. (year old), male patient, seen today for interventional treatment. He  has a past surgical history that includes Varicose vein surgery; Prostate surgery; Thrombosed hemorrhoids; Multiple tooth extractions; Cystoscopy with litholapaxy (N/A, 06/24/2016); and Holmium laser application (N/A, [id number]). [name] has a current medication list which includes the following prescription(s): amlodipine, aspirin, cyanocobalamin, diazepam, duloxetine, esomeprazole, magnesium, meclizine, naproxen sodium, niacin, potassium, and rosuvastatin. His primarily concern today is the Back Pain (lower)  Review of initial evaluation: "According to the patient the primary area of pain is that of the lower back/hips (Bilateral) (R>L).  He denies any prior surgeries or physical therapy.  He admits to having had some nerve blocks in the past (18 years ago) that according to his description appears to have been a series of 3 lumbar epidural steroid injections.  He describes that it helped and he was pain-free for 5 years after that.  He has a recent MRI of the lumbar spine that was done on 05/11/2020.  The results of this MRI were reviewed today with the patient and I did provide him with a copy of those results.   The patient's secondary area pain is that of the lower extremities (Bilateral) (R>L).  Indicates of the right lower extremity the pain goes down to the area of the calf through the posterior aspect of the leg.  He refers no pain, numbness, or weakness below the knee except for that calf region.  Today he is having no left lower extremity pain symptoms.   Physical exam today was positive for pain going down the back of the leg to the calf area upon performing a right-sided  straight leg raise.  He also experienced reproduction of his bilateral low back pain with hyperextension on the lumbar spine.  Provocative hyperextension on rotation of the lumbar spine triggered  ipsilateral low back pain with positive facet arthralgia, bilaterally, with the worst side being the right side.  He indicated that this pain identically reproduced his usual low back pain and leg pain.  Going towards the right side reproduce not only his low back pain but it also reproduce his referred lower extremity pain.  Kemp maneuver was positive bilaterally for bilateral lumbar facet joint pain/arthralgia.  Subsequent provocative Patrick maneuver was negative bilaterally for sacroiliac joint or facet pathology.   The patient commented today that the last time he had a flareup of this low back and lower extremity pain, he received a prescription from his primary care physician for steroids which did help his pain.  Unfortunately not all of it went away and he had to have a second round.  Although this seems to be effective, it did not last long.  Today he comes in looking for other alternatives."  Initial Vital Signs:  Pulse/HCG Rate: 79ECG Heart Rate: 80 Temp: (!) 96.9 F (36.1 C) Resp: 16 BP: (!) 137/97 SpO2: 99 %  BMI: Estimated body mass index is 32.11 kg/m as calculated from the following:   Height as of this encounter: '5\' 7"'$  (1.702 m).   Weight as of this encounter: 205 lb (93 kg).  Risk Assessment: Allergies: Reviewed. He has No Known Allergies.  Allergy Precautions: None required Coagulopathies: Reviewed. None identified.  Blood-thinner therapy: None at this time Active Infection(s): Reviewed. None identified. Mr. Titman is afebrile  Site Confirmation: Mr. Ming was asked to confirm the procedure and laterality before marking the site Procedure checklist: Completed Consent: Before the procedure and under the influence of no sedative(s), amnesic(s), or anxiolytics, the patient was informed of the treatment options, risks and possible complications. To fulfill our ethical and legal obligations, as recommended by the American Medical Association's Code of Ethics, I have informed  the patient of my clinical impression; the nature and purpose of the treatment or procedure; the risks, benefits, and possible complications of the intervention; the alternatives, including doing nothing; the risk(s) and benefit(s) of the alternative treatment(s) or procedure(s); and the risk(s) and benefit(s) of doing nothing. The patient was provided information about the general risks and possible complications associated with the procedure. These may include, but are not limited to: failure to achieve desired goals, infection, bleeding, organ or nerve damage, allergic reactions, paralysis, and death. In addition, the patient was informed of those risks and complications associated to Spine-related procedures, such as failure to decrease pain; infection (i.e.: Meningitis, epidural or intraspinal abscess); bleeding (i.e.: epidural hematoma, subarachnoid hemorrhage, or any other type of intraspinal or peri-dural bleeding); organ or nerve damage (i.e.: Any type of peripheral nerve, nerve root, or spinal cord injury) with subsequent damage to sensory, motor, and/or autonomic systems, resulting in permanent pain, numbness, and/or weakness of one or several areas of the body; allergic reactions; (i.e.: anaphylactic reaction); and/or death. Furthermore, the patient was informed of those risks and complications associated with the medications. These include, but are not limited to: allergic reactions (i.e.: anaphylactic or anaphylactoid reaction(s)); adrenal axis suppression; blood sugar elevation that in diabetics may result in ketoacidosis or comma; water retention that in patients with history of congestive heart failure may result in shortness of breath, pulmonary edema, and decompensation with resultant heart failure; weight gain; swelling or edema;  medication-induced neural toxicity; particulate matter embolism and blood vessel occlusion with resultant organ, and/or nervous system infarction; and/or aseptic  necrosis of one or more joints. Finally, the patient was informed that Medicine is not an exact science; therefore, there is also the possibility of unforeseen or unpredictable risks and/or possible complications that may result in a catastrophic outcome. The patient indicated having understood very clearly. We have given the patient no guarantees and we have made no promises. Enough time was given to the patient to ask questions, all of which were answered to the patient's satisfaction. Mr. Schnick has indicated that he wanted to continue with the procedure. Attestation: I, the ordering provider, attest that I have discussed with the patient the benefits, risks, side-effects, alternatives, likelihood of achieving goals, and potential problems during recovery for the procedure that I have provided informed consent. Date  Time: 07/31/2020  8:44 AM  Pre-Procedure Preparation:  Monitoring: As per clinic protocol. Respiration, ETCO2, SpO2, BP, heart rate and rhythm monitor placed and checked for adequate function Safety Precautions: Patient was assessed for positional comfort and pressure points before starting the procedure. Time-out: I initiated and conducted the "Time-out" before starting the procedure, as per protocol. The patient was asked to participate by confirming the accuracy of the "Time Out" information. Verification of the correct person, site, and procedure were performed and confirmed by me, the nursing staff, and the patient. "Time-out" conducted as per Joint Commission's Universal Protocol (UP.01.01.01). Time: 1000  Description of Procedure:          Laterality: Bilateral. The procedure was performed in identical fashion on both sides. Levels:  L2, L3, L4, L5, & S1 Medial Branch Level(s) Area Prepped: Posterior Lumbosacral Region DuraPrep (Iodine Povacrylex [0.7% available iodine] and Isopropyl Alcohol, 74% w/w) Safety Precautions: Aspiration looking for blood return was conducted prior  to all injections. At no point did we inject any substances, as a needle was being advanced. Before injecting, the patient was told to immediately notify me if he was experiencing any new onset of "ringing in the ears, or metallic taste in the mouth". No attempts were made at seeking any paresthesias. Safe injection practices and needle disposal techniques used. Medications properly checked for expiration dates. SDV (single dose vial) medications used. After the completion of the procedure, all disposable equipment used was discarded in the proper designated medical waste containers. Local Anesthesia: Protocol guidelines were followed. The patient was positioned over the fluoroscopy table. The area was prepped in the usual manner. The time-out was completed. The target area was identified using fluoroscopy. A 12-in long, straight, sterile hemostat was used with fluoroscopic guidance to locate the targets for each level blocked. Once located, the skin was marked with an approved surgical skin marker. Once all sites were marked, the skin (epidermis, dermis, and hypodermis), as well as deeper tissues (fat, connective tissue and muscle) were infiltrated with a small amount of a short-acting local anesthetic, loaded on a 10cc syringe with a 25G, 1.5-in  Needle. An appropriate amount of time was allowed for local anesthetics to take effect before proceeding to the next step. Local Anesthetic: Lidocaine 2.0% The unused portion of the local anesthetic was discarded in the proper designated containers. Technical explanation of process:  L2 Medial Branch Nerve Block (MBB): The target area for the L2 medial branch is at the junction of the postero-lateral aspect of the superior articular process and the superior, posterior, and medial edge of the transverse process of L3. Under fluoroscopic guidance, a  Quincke needle was inserted until contact was made with os over the superior postero-lateral aspect of the pedicular  shadow (target area). After negative aspiration for blood, 0.5 mL of the nerve block solution was injected without difficulty or complication. The needle was removed intact. L3 Medial Branch Nerve Block (MBB): The target area for the L3 medial branch is at the junction of the postero-lateral aspect of the superior articular process and the superior, posterior, and medial edge of the transverse process of L4. Under fluoroscopic guidance, a Quincke needle was inserted until contact was made with os over the superior postero-lateral aspect of the pedicular shadow (target area). After negative aspiration for blood, 0.5 mL of the nerve block solution was injected without difficulty or complication. The needle was removed intact. L4 Medial Branch Nerve Block (MBB): The target area for the L4 medial branch is at the junction of the postero-lateral aspect of the superior articular process and the superior, posterior, and medial edge of the transverse process of L5. Under fluoroscopic guidance, a Quincke needle was inserted until contact was made with os over the superior postero-lateral aspect of the pedicular shadow (target area). After negative aspiration for blood, 0.5 mL of the nerve block solution was injected without difficulty or complication. The needle was removed intact. L5 Medial Branch Nerve Block (MBB): The target area for the L5 medial branch is at the junction of the postero-lateral aspect of the superior articular process and the superior, posterior, and medial edge of the sacral ala. Under fluoroscopic guidance, a Quincke needle was inserted until contact was made with os over the superior postero-lateral aspect of the pedicular shadow (target area). After negative aspiration for blood, 0.5 mL of the nerve block solution was injected without difficulty or complication. The needle was removed intact. S1 Medial Branch Nerve Block (MBB): The target area for the S1 medial branch is at the posterior and  inferior 6 o'clock position of the L5-S1 facet joint. Under fluoroscopic guidance, the Quincke needle inserted for the L5 MBB was redirected until contact was made with os over the inferior and postero aspect of the sacrum, at the 6 o' clock position under the L5-S1 facet joint (Target area). After negative aspiration for blood, 0.5 mL of the nerve block solution was injected without difficulty or complication. The needle was removed intact.  Nerve block solution: 0.2% PF-Ropivacaine + Triamcinolone (40 mg/mL) diluted to a final concentration of 4 mg of Triamcinolone/mL of Ropivacaine The unused portion of the solution was discarded in the proper designated containers. Procedural Needles: 22-gauge, 3.5-inch, Quincke needles used for all levels.  Once the entire procedure was completed, the treated area was cleaned, making sure to leave some of the prepping solution back to take advantage of its long term bactericidal properties.      Illustration of the posterior view of the lumbar spine and the posterior neural structures. Laminae of L2 through S1 are labeled. DPRL5, dorsal primary ramus of L5; DPRS1, dorsal primary ramus of S1; DPR3, dorsal primary ramus of L3; FJ, facet (zygapophyseal) joint L3-L4; I, inferior articular process of L4; LB1, lateral branch of dorsal primary ramus of L1; IAB, inferior articular branches from L3 medial branch (supplies L4-L5 facet joint); IBP, intermediate branch plexus; MB3, medial branch of dorsal primary ramus of L3; NR3, third lumbar nerve root; S, superior articular process of L5; SAB, superior articular branches from L4 (supplies L4-5 facet joint also); TP3, transverse process of L3.  Vitals:   07/31/20 0955 07/31/20 1005  07/31/20 1010 07/31/20 1020  BP: (!) 157/99 (!) 145/100 (!) 142/99 (!) 133/96  Pulse: 78 67 77   Resp: '16 14 16 16  '$ Temp:      TempSrc:      SpO2: 100% 99% 99% 99%  Weight:      Height:         Start Time: 1000 hrs. End Time: 1009  hrs.  Imaging Guidance (Spinal):          Type of Imaging Technique: Fluoroscopy Guidance (Spinal) Indication(s): Assistance in needle guidance and placement for procedures requiring needle placement in or near specific anatomical locations not easily accessible without such assistance. Exposure Time: Please see nurses notes. Contrast: None used. Fluoroscopic Guidance: I was personally present during the use of fluoroscopy. "Tunnel Vision Technique" used to obtain the best possible view of the target area. Parallax error corrected before commencing the procedure. "Direction-depth-direction" technique used to introduce the needle under continuous pulsed fluoroscopy. Once target was reached, antero-posterior, oblique, and lateral fluoroscopic projection used confirm needle placement in all planes. Images permanently stored in EMR. Interpretation: No contrast injected. I personally interpreted the imaging intraoperatively. Adequate needle placement confirmed in multiple planes. Permanent images saved into the patient's record.  Antibiotic Prophylaxis:   Anti-infectives (From admission, onward)    None      Indication(s): None identified  Post-operative Assessment:  Post-procedure Vital Signs:  Pulse/HCG Rate: 77 (nsr)80 Temp: (!) 96.9 F (36.1 C) Resp: 16 BP: (!) 133/96 SpO2: 99 %  EBL: None  Complications: No immediate post-treatment complications observed by team, or reported by patient.  Note: The patient tolerated the entire procedure well. A repeat set of vitals were taken after the procedure and the patient was kept under observation following institutional policy, for this type of procedure. Post-procedural neurological assessment was performed, showing return to baseline, prior to discharge. The patient was provided with post-procedure discharge instructions, including a section on how to identify potential problems. Should any problems arise concerning this procedure, the  patient was given instructions to immediately contact us, at any time, without hesitation. In any case, we plan to contact the patient by telephone for a follow-up status report regarding this interventional procedure.  Comments:  No additional relevant information.  Plan of Care  Orders:  Orders Placed This Encounter  Procedures   LUMBAR FACET(MEDIAL BRANCH NERVE BLOCK) MBNB    Scheduling Instructions:     Procedure: Lumbar facet block (AKA.: Lumbosacral medial branch nerve block)     Side: Bilateral     Level: L3-4, L4-5, & L5-S1 Facets (L2, L3, L4, L5, & S1 Medial Branch Nerves)     Sedation: Patient's choice.     Timeframe: Today    Order Specific Question:   Where will this procedure be performed?    Answer:   [facility]   DG PAIN [facility] C-ARM 1-60 MIN NO REPORT    Intraoperative interpretation by procedural physician at [place].    Standing Status:   Standing    Number of Occurrences:   1    Order Specific Question:   Reason for exam:    Answer:   Assistance in needle guidance and placement for procedures requiring needle placement in or near specific anatomical locations not easily accessible without such assistance.   Informed Consent Details: Physician/Practitioner Attestation; Transcribe to consent form and obtain patient signature    Nursing Order: Transcribe to consent form and obtain patient signature. Note: Always confirm laterality of pain  with Mr. Sthilaire, before procedure.    Order Specific Question:   Physician/Practitioner attestation of informed consent for procedure/surgical case    Answer:   I, the physician/practitioner, attest that I have discussed with the patient the benefits, risks, side effects, alternatives, likelihood of achieving goals and potential problems during recovery for the procedure that I have provided informed consent.    Order Specific Question:   Procedure    Answer:   Lumbar Facet Block  under fluoroscopic guidance     Order Specific Question:   Physician/Practitioner performing the procedure    Answer:   Seerat Peaden A. Dossie Arbour MD    Order Specific Question:   Indication/Reason    Answer:   Low Back Pain, with our without leg pain, due to Facet Joint Arthralgia (Joint Pain) Spondylosis (Arthritis of the Spine), without myelopathy or radiculopathy (Nerve Damage).   Provide equipment / supplies at bedside    "Block Tray" (Disposable  single use) Needle type: SpinalSpinal Amount/quantity: 4 Size: Regular (3.5-inch) Gauge: 22G    Standing Status:   Standing    Number of Occurrences:   1    Order Specific Question:   Specify    Answer:   Block Tray   Saline lock IV    SAFETY PRECAUTION: Please establish a "Saline Lock" IV access in case rapid IV access is required. Discontinue prior to discharge, but no sooner than 15 min after procedure.    Standing Status:   Standing    Number of Occurrences:   1    Chronic Opioid Analgesic:  No chronic opioid analgesics therapy prescribed by our practice.   Medications ordered for procedure: Meds ordered this encounter  Medications   lidocaine (XYLOCAINE) 2 % (with pres) injection 400 mg   ropivacaine (PF) 2 mg/mL (0.2%) (NAROPIN) injection 18 mL   triamcinolone acetonide (KENALOG-40) injection 80 mg    Medications administered: We administered lidocaine, ropivacaine (PF) 2 mg/mL (0.2%), and triamcinolone acetonide.  See the medical record for exact dosing, route, and time of administration.  Follow-up plan:   Return in about 2 weeks (around 08/14/2020) for procedure day (afternoon F2F) (PPE).       Interventional Therapies  Risk  Complexity Considerations:   Estimated body mass index is 32.11 kg/m as calculated from the following:   Height as of this encounter: '5\' 7"'$  (1.702 m).   Weight as of this encounter: 205 lb (93 kg). WNL   Planned  Pending:   Pending further evaluation   Under consideration:   Diagnostic bilateral lumbar facet block #2     Completed:   Diagnostic bilateral lumbar facet block x1 (07/31/2020)    Therapeutic  Palliative (PRN) options:   None established    Recent Visits Date Type Provider Dept  07/25/20 Office Visit Milinda Pointer, MD [facility]  Showing recent visits within past 90 days and meeting all other requirements Today's Visits Date Type Provider Dept  07/31/20 Procedure visit Milinda Pointer, MD [facility]  Showing today's visits and meeting all other requirements Future Appointments Date Type Provider Dept  08/28/20 Appointment Milinda Pointer, MD [facility]  Showing future appointments within next 90 days and meeting all other requirements Disposition: Discharge home  Discharge (Date  Time): 07/31/2020; 1027 hrs.   Primary Care Physician: Leone Haven, MD Location: [facility] Note by: Gaspar Cola, MD Date: 07/31/2020; Time: 3:20 PM  Disclaimer:  Medicine is not an Chief Strategy Officer. The only guarantee in  medicine is that nothing is guaranteed. It is important to note that the decision to proceed with this intervention was based on the information collected from the patient. The Data and conclusions were drawn from the patient's questionnaire, the interview, and the physical examination. Because the information was provided in large part by the patient, it cannot be guaranteed that it has not been purposely or unconsciously manipulated. Every effort has been made to obtain as much relevant data as possible for this evaluation. It is important to note that the conclusions that lead to this procedure are derived in large part from the available data. Always take into account that the treatment will also be dependent on availability of resources and existing treatment guidelines, considered by other Pain Management Practitioners as being common knowledge and practice, at the time of the intervention. For Medico-Legal  purposes, it is also important to point out that variation in procedural techniques and pharmacological choices are the acceptable norm. The indications, contraindications, technique, and results of the above procedure should only be interpreted and judged by a Board-Certified Interventional Pain Specialist with extensive familiarity and expertise in the same exact procedure and technique.

## 2020-07-31 ENCOUNTER — Ambulatory Visit (HOSPITAL_BASED_OUTPATIENT_CLINIC_OR_DEPARTMENT_OTHER): Payer: BC Managed Care – PPO | Admitting: Pain Medicine

## 2020-07-31 ENCOUNTER — Ambulatory Visit
Admission: RE | Admit: 2020-07-31 | Discharge: 2020-07-31 | Disposition: A | Discharge status: Home / Self Care | Payer: BC Managed Care – PPO | Source: Ambulatory Visit | Attending: Pain Medicine | Admitting: Pain Medicine

## 2020-07-31 ENCOUNTER — Other Ambulatory Visit: Payer: Self-pay

## 2020-07-31 ENCOUNTER — Encounter: Payer: Self-pay | Admitting: Pain Medicine

## 2020-07-31 VITALS — BP 133/96 | HR 77 | Temp 96.9°F | Resp 16 | Ht 67.0 in | Wt 205.0 lb

## 2020-07-31 DIAGNOSIS — M545 Low back pain, unspecified: Secondary | ICD-10-CM | POA: Insufficient documentation

## 2020-07-31 DIAGNOSIS — M25551 Pain in right hip: Secondary | ICD-10-CM

## 2020-07-31 DIAGNOSIS — M47817 Spondylosis without myelopathy or radiculopathy, lumbosacral region: Secondary | ICD-10-CM

## 2020-07-31 DIAGNOSIS — M79604 Pain in right leg: Secondary | ICD-10-CM | POA: Diagnosis present

## 2020-07-31 DIAGNOSIS — M5137 Other intervertebral disc degeneration, lumbosacral region: Secondary | ICD-10-CM | POA: Insufficient documentation

## 2020-07-31 DIAGNOSIS — M47816 Spondylosis without myelopathy or radiculopathy, lumbar region: Secondary | ICD-10-CM | POA: Insufficient documentation

## 2020-07-31 DIAGNOSIS — M79605 Pain in left leg: Secondary | ICD-10-CM | POA: Diagnosis present

## 2020-07-31 DIAGNOSIS — G8929 Other chronic pain: Secondary | ICD-10-CM

## 2020-07-31 DIAGNOSIS — M25552 Pain in left hip: Secondary | ICD-10-CM | POA: Insufficient documentation

## 2020-07-31 MED ORDER — TRIAMCINOLONE ACETONIDE 40 MG/ML IJ SUSP
80.0000 mg | Freq: Once | INTRAMUSCULAR | Status: AC
  Administered 2020-07-31: 80 mg

## 2020-07-31 MED ORDER — TRIAMCINOLONE ACETONIDE 40 MG/ML IJ SUSP
INTRAMUSCULAR | Status: AC
  Filled 2020-07-31: qty 2

## 2020-07-31 MED ORDER — ROPIVACAINE HCL 2 MG/ML IJ SOLN
18.0000 mL | Freq: Once | INTRAMUSCULAR | Status: AC
  Administered 2020-07-31: 18 mL via PERINEURAL

## 2020-07-31 MED ORDER — LIDOCAINE HCL (PF) 2 % IJ SOLN
INTRAMUSCULAR | Status: AC
  Filled 2020-07-31: qty 10

## 2020-07-31 MED ORDER — ROPIVACAINE HCL 2 MG/ML IJ SOLN
INTRAMUSCULAR | Status: AC
  Filled 2020-07-31: qty 20

## 2020-07-31 MED ORDER — LIDOCAINE HCL 2 % IJ SOLN
20.0000 mL | Freq: Once | INTRAMUSCULAR | Status: AC
  Administered 2020-07-31: 400 mg

## 2020-07-31 NOTE — Patient Instructions (Signed)

## 2020-08-01 ENCOUNTER — Telehealth: Payer: Self-pay

## 2020-08-01 NOTE — Telephone Encounter (Signed)
Post procedure phone call.  Patient states he is doing great.  

## 2020-08-27 NOTE — Progress Notes (Signed)
PROVIDER NOTE: Information contained herein reflects review and annotations entered in association with encounter. Interpretation of such information and data should be left to medically-trained personnel. Information provided to patient can be located elsewhere in the medical record under "Patient Instructions". Document created using STT-dictation technology, any transcriptional errors that may result from process are unintentional.    Patient: Brian Luna  Service Category: E/M  Provider: Gaspar Cola, MD  DOB: 11-Aug-1955  DOS: 08/28/2020  Specialty: Interventional Pain Management  MRN: [id number]  Setting: Ambulatory outpatient  PCP: Leone Haven, MD  Type: Established Patient    Referring Provider: Leone Haven, MD  Location: Office  Delivery: Face-to-face     HPI  Brian Luna, a 65 y.o. year old male, is here today because of his Chronic bilateral low back pain without sciatica [M54.50, G89.29]. Brian Luna primary complain today is right buttock pain Last encounter: My last encounter with him was on 07/31/2020. Pertinent problems: Brian Luna has History of prostate cancer; Right knee pain; 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); Thoracic back pain; Neurogenic claudication (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 Pain Assessment: Severity of Chronic pain is reported as a 0-No pain/10. Location: Buttocks (right side)  /at times both calf start with tingling. Onset: More than a month ago. Quality: Tingling. Timing: Intermittent. Modifying factor(s): procedures and aleve. Vitals:  height is _0  (1.702 m) and weight is 192 lb (87.1 kg). His temporal temperature is 97 F (36.1 C) (abnormal). His blood pressure is 134/92 (abnormal) and his pulse is 79. His respiration is 16 and oxygen saturation is 100%.   Reason for encounter: post-procedure assessment.  The patient indicates having attained a 100% ongoing relief of his low back pain with the diagnostic bilateral lumbar facet block under fluoroscopic guidance.  This confirms that the patient's pain was coming from the facet joints and that there was some inflammatory involvement with it.  At this point, he refers not having any pain and not needing anything else.  I have encouraged the patient to give [place] a call if and when the pain returns.  He may be an adequate candidate for radiofrequency ablation.  Post-Procedure Evaluation  Procedure (07/31/2020):  Procedure:           Anesthesia, Analgesia, Anxiolysis:  Type: Lumbar Facet, Medial Branch Block(s)          Primary Purpose: Diagnostic Region: Posterolateral Lumbosacral Spine Level: L2, L3, L4, L5, & S1 Medial Branch Level(s). Injecting these levels blocks the L3-4, L4-5, and L5-S1 lumbar facet joints. Laterality: Bilateral   Type: Local Anesthesia Indication(s): Analgesia         Route: Infiltration ([place]/IM) IV Access: Declined Sedation: Declined  Local Anesthetic: Lidocaine 1-2%   Position: Prone   Pre-procedure pain level: 3/10 Post-procedure: 0/10          Anxiolysis: none.  Effectiveness during initial hour after procedure (Ultra-Short Term Relief): 100 %.  Local  anesthetic used: Long-acting (4-6 hours) Effectiveness: Defined as any analgesic benefit obtained secondary to the administration of local anesthetics. This carries significant diagnostic value as to the etiological location, or anatomical origin, of the pain. Duration of benefit is expected to coincide with the duration of the local anesthetic used.  Effectiveness during initial 4-6 hours after procedure (Short-Term Relief): 100 %.  Long-term benefit: Defined as any relief past the pharmacologic duration of the local anesthetics.  Effectiveness past the initial 6 hours after procedure (Long-Term Relief): 100 %.  Benefits, current: Defined as benefit present at the time of this evaluation.   Analgesia:   The patient is currently enjoying an ongoing 100% relief of his bilateral low back pain. Function: Brian Luna reports improvement in function ROM: Brian Luna reports improvement in ROM  Pharmacotherapy Assessment  Analgesic: No chronic opioid analgesics therapy prescribed by our practice.   Monitoring: [place] PMP: PDMP reviewed during this encounter.       Pharmacotherapy: No side-effects or adverse reactions reported. Compliance: No problems identified. Effectiveness: Clinically acceptable.  Chauncey Fischer, RN  08/28/2020  2:01 PM  Sign when Signing Visit Safety precautions to be maintained throughout the outpatient stay will include: orient to surroundings, keep bed in low position, maintain call bell within reach at all times, provide assistance with transfer out of bed and ambulation.      UDS:  Summary  Date Value Ref Range Status  07/25/2020 Note  Final    Comment:    ==================================================================== Compliance Drug Analysis, Ur ==================================================================== Test                             Result       Flag       Units  Drug Present and Declared for Prescription Verification   Duloxetine                      PRESENT      EXPECTED   Naproxen                       PRESENT      EXPECTED  Drug Absent but Declared for Prescription Verification   Diazepam                       Not Detected UNEXPECTED ng/mg creat   Salicylate                     Not Detected UNEXPECTED    Aspirin, as indicated in the declared medication list, is not always    detected even when used as directed.  ==================================================================== Test                      Result    Flag   Units      Ref Range   Creatinine              96               mg/dL      >=20 ==================================================================== Declared Medications:  The flagging and interpretation on this report are based on the  following declared medications.  Unexpected results may arise from  inaccuracies in the declared medications.   **Note: The testing scope of this panel includes these medications:  Diazepam (Valium)  Duloxetine (Cymbalta)  Naproxen (Aleve)   **Note: The testing scope of this panel does not include small to  moderate amounts of these reported medications:   Aspirin   **Note: The testing scope of this panel does not include the  following reported medications:   Amlodipine (Norvasc)  Cyanocobalamin  Esomeprazole (Nexium)  Magnesium  Meclizine (Antivert)  Niacin  Potassium (Klor-Con)  Rosuvastatin (Crestor) ==================================================================== For clinical consultation, please call [phone]. ====================================================================      ROS  Constitutional: Denies any fever or chills Gastrointestinal: No reported hemesis, hematochezia, vomiting, or acute GI distress Musculoskeletal: Denies any acute onset joint swelling, redness, loss of ROM, or weakness Neurological: No reported episodes of acute onset apraxia, aphasia, dysarthria, agnosia, amnesia, paralysis, loss of coordination, or loss of  consciousness  Medication Review  DULoxetine, Magnesium, Potassium, amLODipine, aspirin, cyanocobalamin, diazepam, esomeprazole, meclizine, naproxen sodium, niacin, and rosuvastatin  History Review  Allergy: Brian Luna has No Known Allergies. Drug: Brian Luna  reports no history of drug use. Alcohol:  reports current alcohol use. Tobacco:  reports that he quit smoking about 8 years ago. His smoking use included cigarettes. He has a 60.00 pack-year smoking history. He has never used smokeless tobacco. Social: Brian Luna  reports that he quit smoking about 8 years ago. His smoking use included cigarettes. He has a 60.00 pack-year smoking history. He has never used smokeless tobacco. He reports current alcohol use. He reports that he does not use drugs. Medical:  has a past medical history of Blood in stool, Chickenpox, Depression, GERD (gastroesophageal reflux disease), History of kidney stones, Prostate cancer ([place]), Varicose veins, and Vertigo. Surgical: Brian Luna  has a past surgical history that includes Varicose vein surgery; Prostate surgery; Thrombosed hemorrhoids; Multiple tooth extractions; Cystoscopy with litholapaxy (N/A, 06/24/2016); and Holmium laser application (N/A, 03/01/4973). Family: family history includes Arthritis in an other family member; Heart attack (age of onset: 88) in his paternal grandfather; Heart attack (age of onset: 17) in his father.  Laboratory Chemistry Profile   Renal Lab Results  Component Value Date   BUN 12 07/25/2020   CREATININE 0.95 07/25/2020   BCR 13 07/25/2020   GFR 69.83 06/27/2019    Hepatic Lab Results  Component Value Date   AST 32 07/25/2020   ALT 29 06/27/2019   ALBUMIN 4.5 07/25/2020   ALKPHOS 105 07/25/2020   HCVAB NON-REACTIVE 09/03/2016    Electrolytes Lab Results  Component Value Date   NA 142 07/25/2020   K 4.9 07/25/2020   CL 103 07/25/2020   CALCIUM 10.1 07/25/2020   MG 2.3 07/25/2020    Bone Lab Results  Component  Value Date   25OHVITD1 56 07/25/2020   25OHVITD2 <1.0 07/25/2020   25OHVITD3 56 07/25/2020    Inflammation (CRP: Acute Phase) (ESR: Chronic Phase) Lab Results  Component Value Date   CRP 2 07/25/2020   ESRSEDRATE 9 07/25/2020         Note: Above Lab results reviewed.  Recent Imaging Review  DG PAIN CLINIC C-ARM 1-60 MIN NO REPORT Fluoro was used, but no Radiologist interpretation will be provided.  Please refer to "NOTES" tab for provider progress note. Note: Reviewed        Physical Exam  General appearance: Well nourished, well developed, and well hydrated. In no apparent acute distress Mental status: Alert, oriented x 3 (person, place, & time)       Respiratory: No evidence of acute respiratory distress Eyes: PERLA Vitals: BP (!) 134/92 (  BP Location: Right Arm, Patient Position: Sitting, Cuff Size: Large)   Pulse 79   Temp (!) 97 F (36.1 C) (Temporal)   Resp 16   Ht _0  (1.702 m)   Wt 192 lb (87.1 kg)   SpO2 100%   BMI 30.07 kg/m  BMI: Estimated body mass index is 30.07 kg/m as calculated from the following:   Height as of this encounter: _1  (1.702 m).   Weight as of this encounter: 192 lb (87.1 kg). Ideal: Ideal body weight: 66.1 kg (145 lb 11.6 oz) Adjusted ideal body weight: 74.5 kg (164 lb 3.8 oz)  Assessment   Status Diagnosis  Controlled Controlled Controlled 1. Chronic low back pain (Bilateral) w/o sciatica   2. Lumbar facet joint syndrome (Bilateral)   3. Chronic hip pain (2ry area of Pain) (Bilateral)   4. Chronic lower extremity pain (3ry area of Pain) (Bilateral)   5. Chronic pain syndrome      Updated Problems: No problems updated.  Plan of Care  Problem-specific:  No problem-specific Assessment & Plan notes found for this encounter.  Brian Luna has a current medication list which includes the following long-term medication(s): amlodipine, duloxetine, esomeprazole, potassium, and rosuvastatin.  Pharmacotherapy (Medications  Ordered): No orders of the defined types were placed in this encounter.  Orders:  No orders of the defined types were placed in this encounter.  Follow-up plan:   Return if symptoms worsen or fail to improve.     Interventional Therapies  Risk  Complexity Considerations:   Estimated body mass index is 32.11 kg/m as calculated from the following:   Height as of this encounter: _2  (1.702 m).   Weight as of this encounter: 205 lb (93 kg). WNL   Planned  Pending:   Pending further evaluation   Under consideration:   Diagnostic bilateral lumbar facet block #2    Completed:   Diagnostic bilateral lumbar facet block x1 (07/31/2020)    Therapeutic  Palliative (PRN) options:   None established     Recent Visits Date Type Provider Dept  07/31/20 Procedure visit Milinda Pointer, MD [facility]  07/25/20 Office Visit Milinda Pointer, MD [facility]  Showing recent visits within past 90 days and meeting all other requirements Today's Visits Date Type Provider Dept  08/28/20 Office Visit Milinda Pointer, MD [facility]  Showing today's visits and meeting all other requirements Future Appointments No visits were found meeting these conditions. Showing future appointments within next 90 days and meeting all other requirements I discussed the assessment and treatment plan with the patient. The patient was provided an opportunity to ask questions and all were answered. The patient agreed with the plan and demonstrated an understanding of the instructions.  Patient advised to call back or seek an in-person evaluation if the symptoms or condition worsens.  Duration of encounter: 30 minutes.  Note by: Gaspar Cola, MD Date: 08/28/2020; Time: 5:59 PM

## 2020-08-28 ENCOUNTER — Ambulatory Visit: Payer: BC Managed Care – PPO | Attending: Pain Medicine | Admitting: Pain Medicine

## 2020-08-28 ENCOUNTER — Other Ambulatory Visit: Payer: Self-pay

## 2020-08-28 ENCOUNTER — Encounter: Payer: Self-pay | Admitting: Pain Medicine

## 2020-08-28 VITALS — BP 134/92 | HR 79 | Temp 97.0°F | Resp 16 | Ht 67.0 in | Wt 192.0 lb

## 2020-08-28 DIAGNOSIS — M79605 Pain in left leg: Secondary | ICD-10-CM | POA: Diagnosis present

## 2020-08-28 DIAGNOSIS — G894 Chronic pain syndrome: Secondary | ICD-10-CM | POA: Diagnosis present

## 2020-08-28 DIAGNOSIS — M25551 Pain in right hip: Secondary | ICD-10-CM | POA: Diagnosis not present

## 2020-08-28 DIAGNOSIS — M25552 Pain in left hip: Secondary | ICD-10-CM | POA: Diagnosis present

## 2020-08-28 DIAGNOSIS — M47816 Spondylosis without myelopathy or radiculopathy, lumbar region: Secondary | ICD-10-CM | POA: Insufficient documentation

## 2020-08-28 DIAGNOSIS — M79604 Pain in right leg: Secondary | ICD-10-CM | POA: Insufficient documentation

## 2020-08-28 DIAGNOSIS — M545 Low back pain, unspecified: Secondary | ICD-10-CM | POA: Insufficient documentation

## 2020-08-28 DIAGNOSIS — G8929 Other chronic pain: Secondary | ICD-10-CM | POA: Insufficient documentation

## 2020-08-28 NOTE — Progress Notes (Signed)
Safety precautions to be maintained throughout the outpatient stay will include: orient to surroundings, keep bed in low position, maintain call bell within reach at all times, provide assistance with transfer out of bed and ambulation.  

## 2020-10-10 ENCOUNTER — Ambulatory Visit: Payer: BC Managed Care – PPO | Admitting: Family Medicine

## 2020-10-10 ENCOUNTER — Other Ambulatory Visit: Payer: Self-pay

## 2020-10-10 VITALS — BP 121/70 | HR 70 | Temp 98.5°F | Ht 67.0 in | Wt 198.2 lb

## 2020-10-10 DIAGNOSIS — E7800 Pure hypercholesterolemia, unspecified: Secondary | ICD-10-CM

## 2020-10-10 DIAGNOSIS — G8929 Other chronic pain: Secondary | ICD-10-CM

## 2020-10-10 DIAGNOSIS — E6609 Other obesity due to excess calories: Secondary | ICD-10-CM

## 2020-10-10 DIAGNOSIS — Z23 Encounter for immunization: Secondary | ICD-10-CM | POA: Diagnosis not present

## 2020-10-10 DIAGNOSIS — M5442 Lumbago with sciatica, left side: Secondary | ICD-10-CM | POA: Diagnosis not present

## 2020-10-10 DIAGNOSIS — Z6830 Body mass index (BMI) 30.0-30.9, adult: Secondary | ICD-10-CM

## 2020-10-10 NOTE — Assessment & Plan Note (Signed)
Congratulated on weight loss.  I encouraged him to continue with his diet changes.  Discussed he may need to add in some exercise to help him lose additional weight.  A1c checked today for diabetes screening.

## 2020-10-10 NOTE — Assessment & Plan Note (Signed)
Significantly improved.  He will monitor for any worsening symptoms.

## 2020-10-10 NOTE — Progress Notes (Signed)
Tommi Rumps, MD Phone: [phone]  Brian Luna is a 65 y.o. male who presents today for follow-up.  HYPERLIPIDEMIA Symptoms Chest pain on exertion:  no   Leg claudication:   no Medications: Compliance- taking crestor Right upper quadrant pain- no  Muscle aches- no Lipid Panel     Component Value Date/Time   CHOL 124 10/28/2019 1557   TRIG 142 10/28/2019 1557   HDL 46 10/28/2019 1557   CHOLHDL 2.7 10/28/2019 1557   VLDL 20.8 10/19/2017 1516   LDLCALC 55 10/28/2019 1557   LDLDIRECT 58.0 12/02/2016 0850   Chronic back pain: This has significantly improved.  He saw pain management and had facet blocks.  He notes very minimal pain that only occurs occasionally.  No radiation.  No weakness.  No incontinence.  Notes occasionally his feet will be a little numb depending on how he stands.  Obesity: Patient notes he has cut down on snacking and is eating less muffins in the morning.  He is trying to eat healthier.  His work is very active.  He is working on losing weight.  Social History   Tobacco Use  Smoking Status Former   Packs/day: 1.50   Years: 40.00   Pack years: 60.00   Types: Cigarettes   Quit date: 06/19/2012   Years since quitting: 8.3  Smokeless Tobacco Never    Current Outpatient Medications on File Prior to Visit  Medication Sig Dispense Refill   amLODipine (NORVASC) 5 MG tablet TAKE 1 TABLET BY MOUTH ONCE DAILY 90 tablet 1   aspirin 81 MG tablet Take 81 mg by mouth daily.     cyanocobalamin 2000 MCG tablet Take 2,000 mcg by mouth daily.     diazepam (VALIUM) 10 MG tablet Take 1 tablet (10 mg total) by mouth 60 (sixty) minutes before procedure for 1 dose. Take with a sip of water, on an empty stomach. Do not eat anything for 6 hours prior to procedure. 1 tablet 0   DULoxetine (CYMBALTA) 30 MG capsule TAKE 1 CAPSULE BY MOUTH ONCE DAILY FOR 7DAYS THEN 2 CAPSULES DAILY 180 capsule 1   esomeprazole (NEXIUM) 20 MG capsule Take 20 mg by mouth daily.      Magnesium  500 MG TABS Take 500 mg by mouth daily.     meclizine (ANTIVERT) 25 MG tablet TAKE 1/2 TABLET BY MOUTH 3 TIMES DAILY AS NEEDED 30 tablet 0   naproxen sodium (ALEVE) 220 MG tablet Take 440 mg by mouth daily.     niacin 500 MG tablet Take 500 mg by mouth daily.      Potassium 99 MG TABS Take 99 mg by mouth daily.     rosuvastatin (CRESTOR) 20 MG tablet TAKE 1 TABLET BY MOUTH ONCE EVERY EVENING 90 tablet 3   No current facility-administered medications on file prior to visit.     ROS see history of present illness  Objective  Physical Exam Vitals:   10/10/20 1630  BP: 121/70  Pulse: 70  Temp: 98.5 F (36.9 C)  SpO2: 98%    BP Readings from Last 3 Encounters:  10/10/20 121/70  08/28/20 (!) 134/92  07/31/20 (!) 133/96   Wt Readings from Last 3 Encounters:  10/10/20 198 lb 3.2 oz (89.9 kg)  08/28/20 192 lb (87.1 kg)  07/31/20 205 lb (93 kg)    Physical Exam Constitutional:      General: He is not in acute distress.    Appearance: He is not diaphoretic.  Cardiovascular:  Rate and Rhythm: Normal rate and regular rhythm.     Heart sounds: Normal heart sounds.  Pulmonary:     Effort: Pulmonary effort is normal.     Breath sounds: Normal breath sounds.  Musculoskeletal:     Comments: No midline spine tenderness, no midline spine step-off, no muscular back tenderness  Skin:    General: Skin is warm and dry.  Neurological:     Mental Status: He is alert.     Assessment/Plan: Please see individual problem list.  Problem List Items Addressed This Visit     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) - Primary (Chronic)    Significantly improved.  He will monitor for any worsening symptoms.      Elevated LDL cholesterol level    Check lipid panel.  Continue Crestor 20 mg once daily.  Most recent liver enzymes reviewed.      Relevant Orders   Lipid panel   Obesity    Congratulated on weight loss.  I encouraged him to continue with his diet changes.   Discussed he may need to add in some exercise to help him lose additional weight.  A1c checked today for diabetes screening.      Relevant Orders   HgB A1c     Return in about 6 months (around 04/10/2021).  This visit occurred during the SARS-CoV-2 public health emergency.  Safety protocols were in place, including screening questions prior to the visit, additional usage of staff PPE, and extensive cleaning of exam room while observing appropriate contact time as indicated for disinfecting solutions.    Tommi Rumps, MD [place]

## 2020-10-10 NOTE — Assessment & Plan Note (Addendum)
Check lipid panel.  Continue Crestor 20 mg once daily.  Most recent liver enzymes reviewed.

## 2020-10-10 NOTE — Patient Instructions (Signed)
Nice to see you. We will check lab work today. Please monitor back. Please continue to work on diet and exercise.

## 2020-10-11 LAB — HEMOGLOBIN A1C: Hgb A1c MFr Bld: 5.1 % (ref 4.6–6.5)

## 2020-10-11 LAB — LIPID PANEL
Cholesterol: 133 mg/dL (ref 0–200)
HDL: 51.3 mg/dL (ref >39.00)
LDL Cholesterol: 57 mg/dL (ref 0–99)
NonHDL: 81.75
Total CHOL/HDL Ratio: 3
Triglycerides: 122 mg/dL (ref 0.0–149.0)
VLDL: 24.4 mg/dL (ref 0.0–40.0)

## 2020-12-15 ENCOUNTER — Other Ambulatory Visit: Payer: Self-pay | Admitting: Family Medicine

## 2021-01-23 IMAGING — DX DG KNEE COMPLETE 4+V*R*
4 series · 4 of 4 positions shown · non-contrast
Comparison: None

CLINICAL DATA: Pain and swelling RIGHT knee for 1 week, no known
injury, anterior pain distal to patella and medial knee pain

EXAM:
RIGHT KNEE - COMPLETE 4+ VIEW

[knee ap]
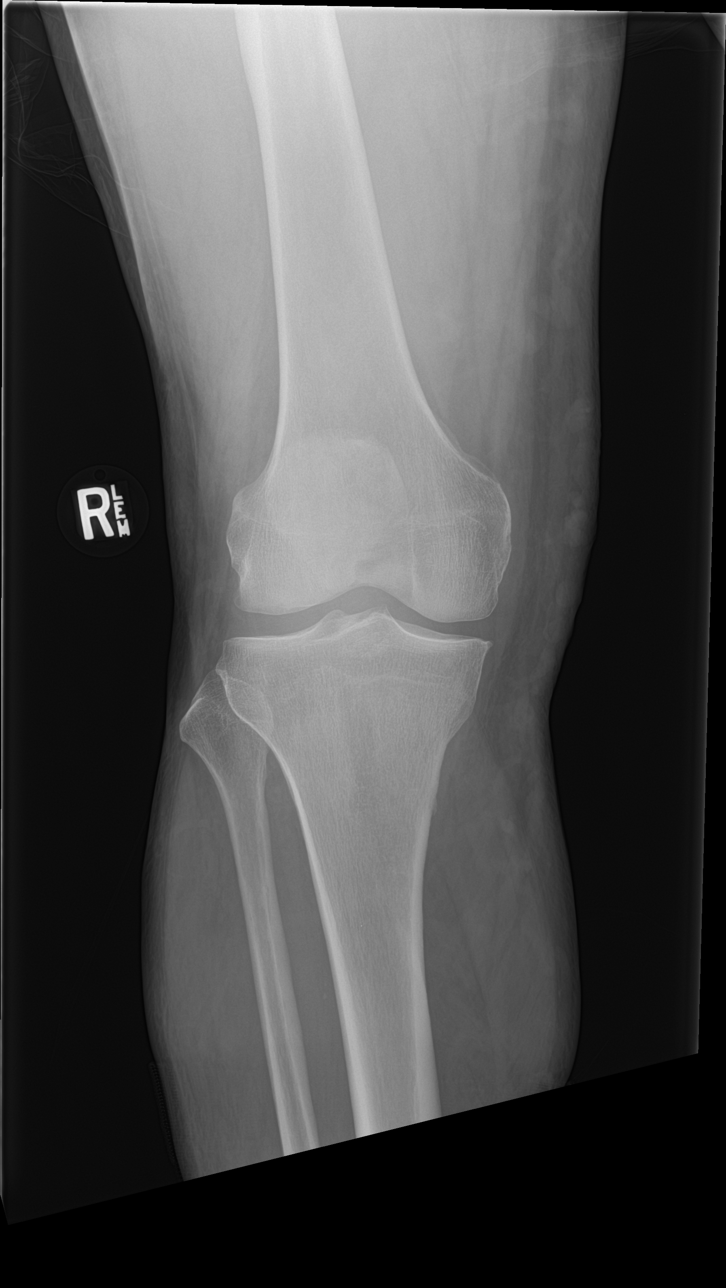

[knee lat]
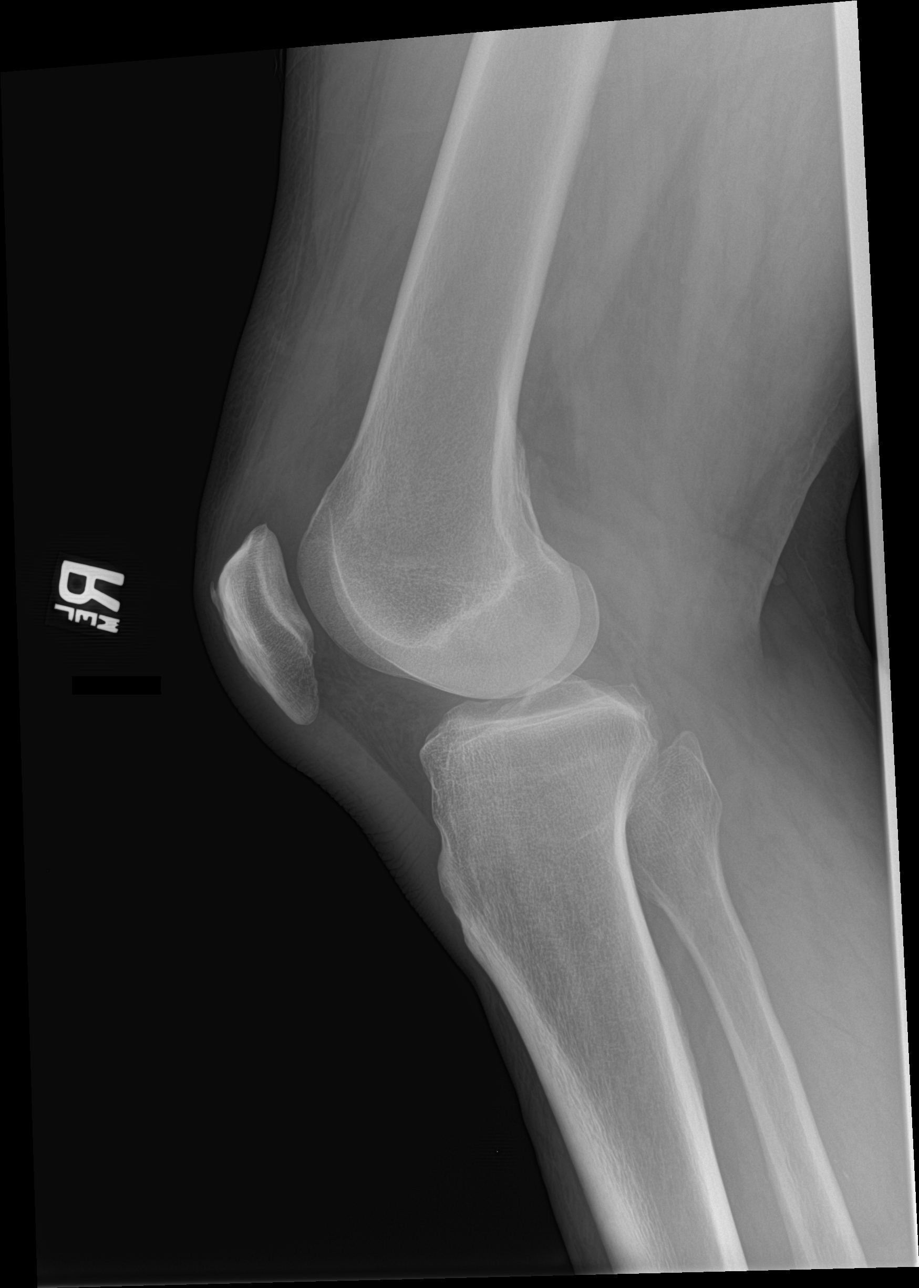

[knee obl (1 of 2)]
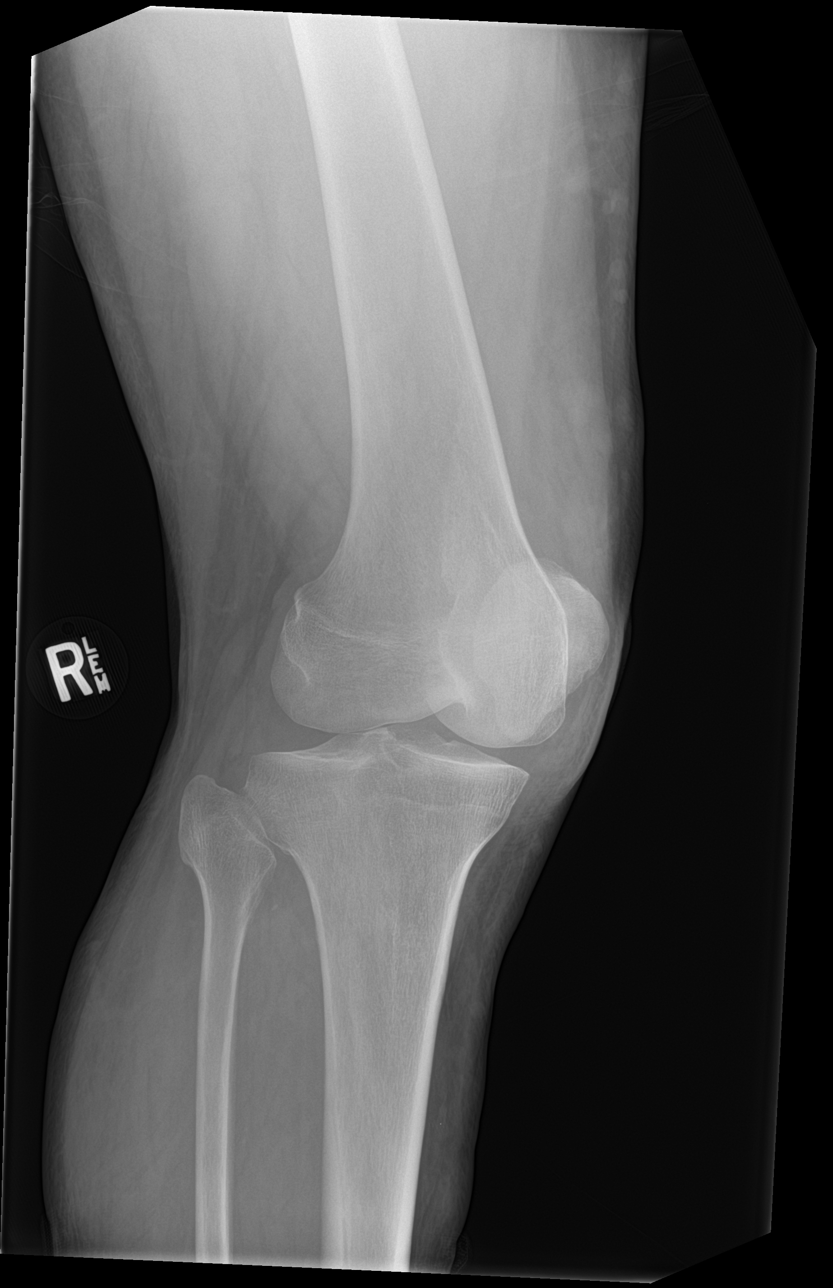

[knee obl (2 of 2)]
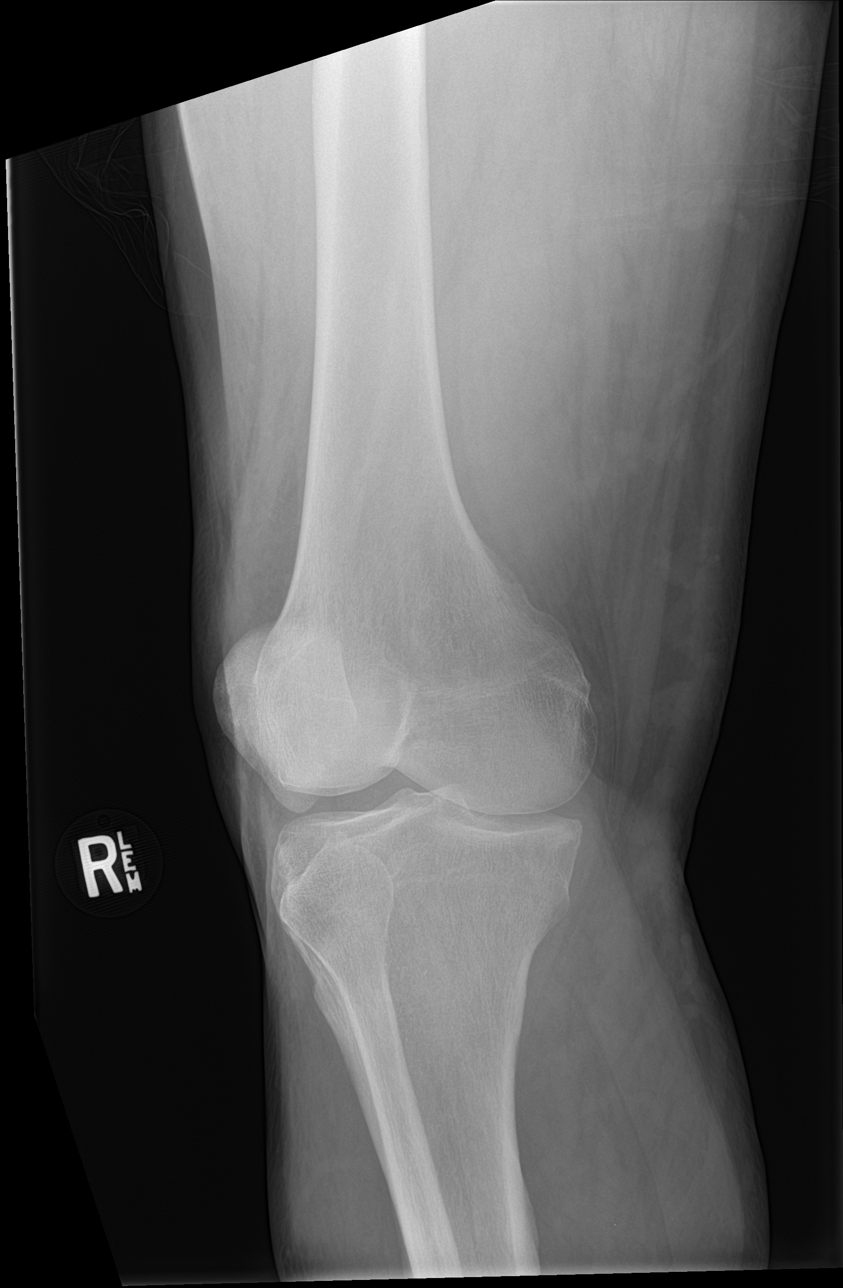

[4 of 4 positions shown; findings below may reference images not displayed]

FINDINGS: Osseous mineralization low normal.

Joint spaces preserved.

No acute fracture, dislocation, or bone destruction.

No significant joint effusion.

Serpiginous soft tissue densities at medial aspect of RIGHT leg
question venous varicosities.
IMPRESSION: No acute osseous abnormalities.

Suspected venous varicosities at medial RIGHT leg.

## 2021-01-23 IMAGING — US US EXTREM LOW VENOUS*L*
1 series · 13 of 24 positions shown · non-contrast
Comparison: None.

CLINICAL DATA: Linear lesions the medial thigh history of DVT



[Series 1: us extrem low venous*left* · 13 of 51 slices shown]
[im 1/51]
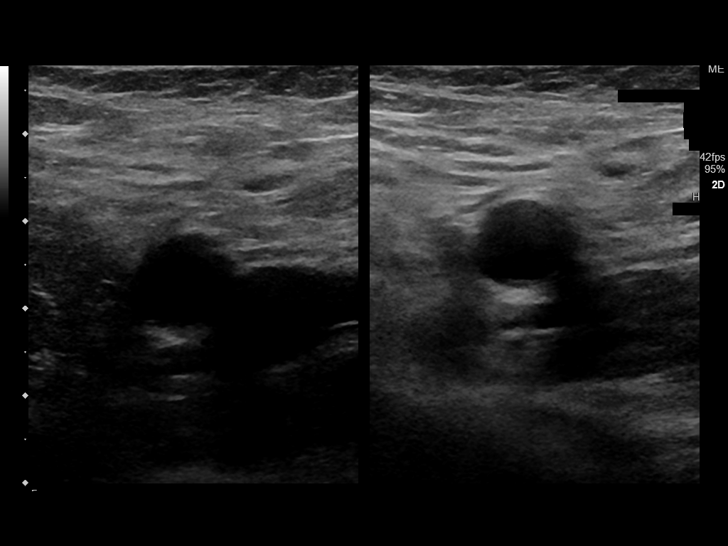
[im 5/51]
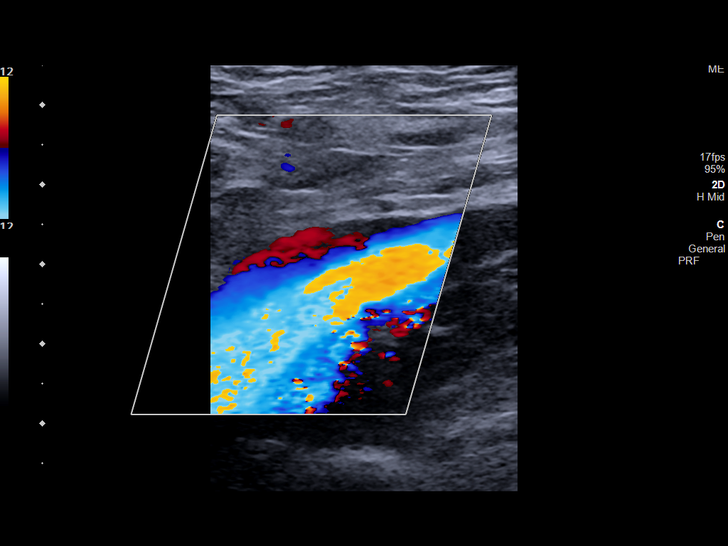
[im 9/51]
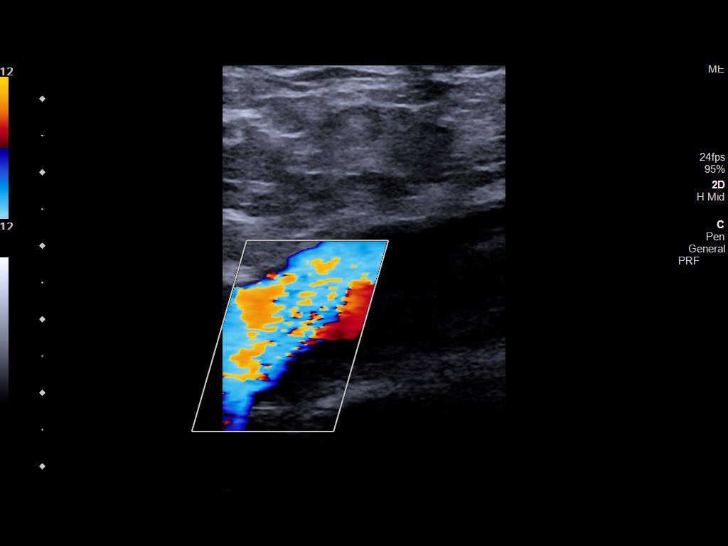
[im 14/51]
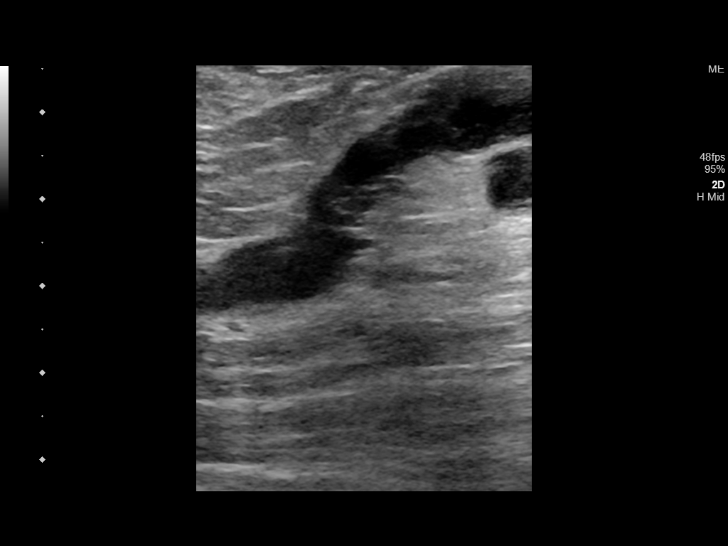
[im 18/51]
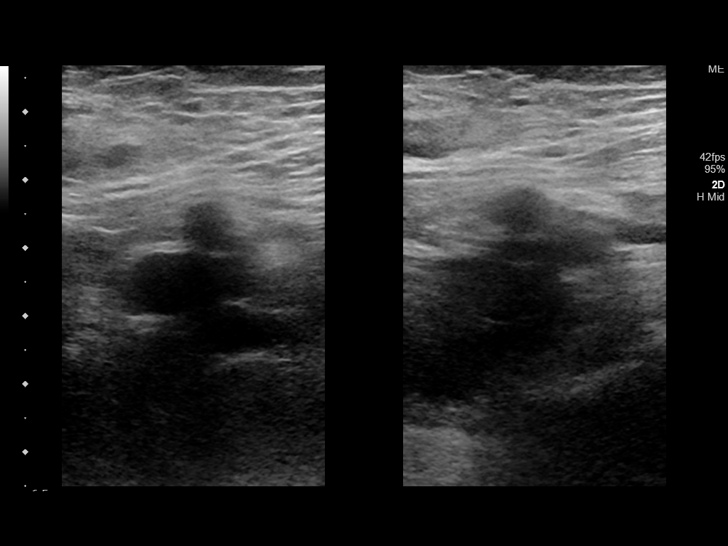
[im 22/51]
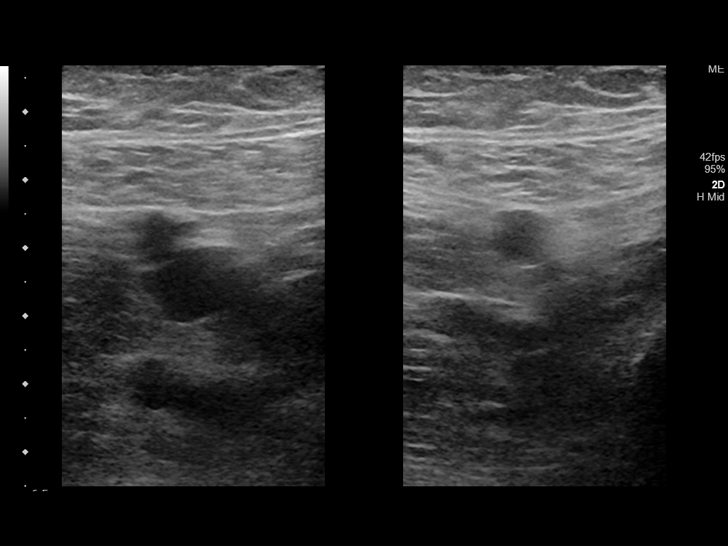
[im 27/51]
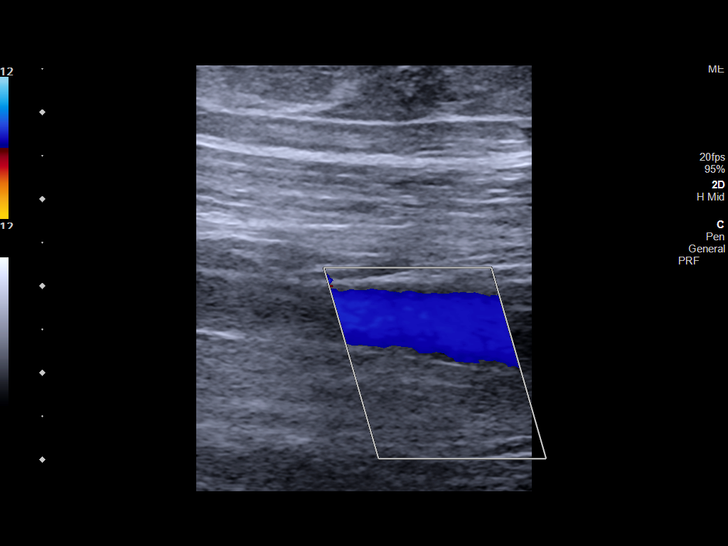
[im 29/51]
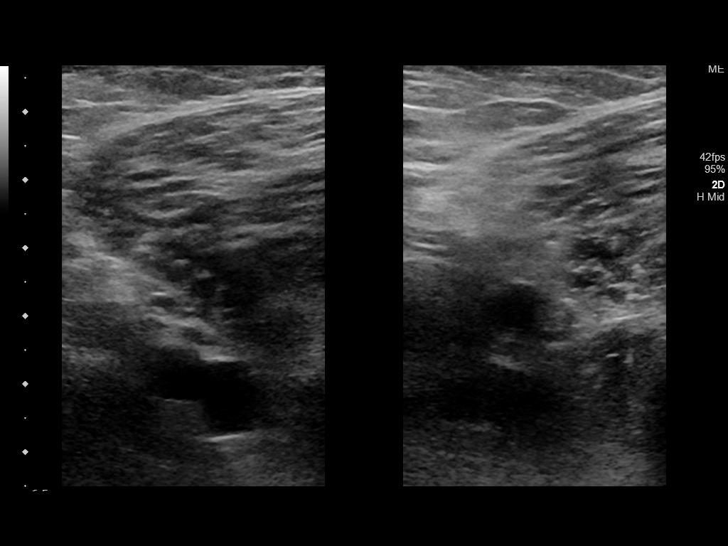
[im 33/51]
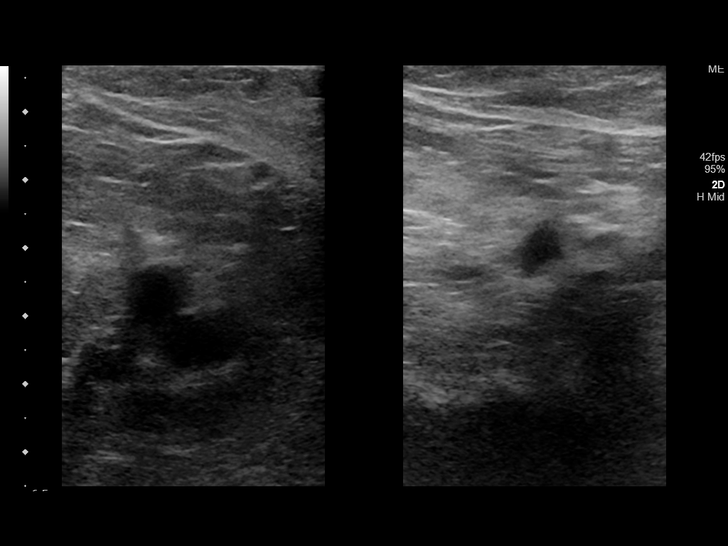
[im 37/51]
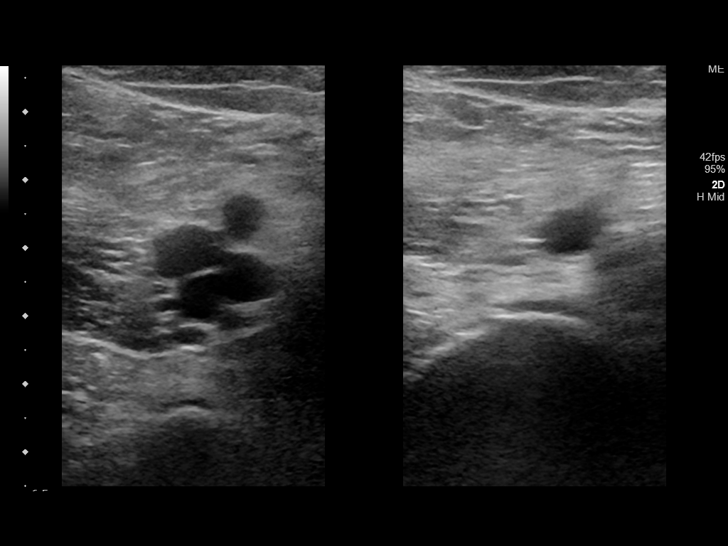
[im 42/51]
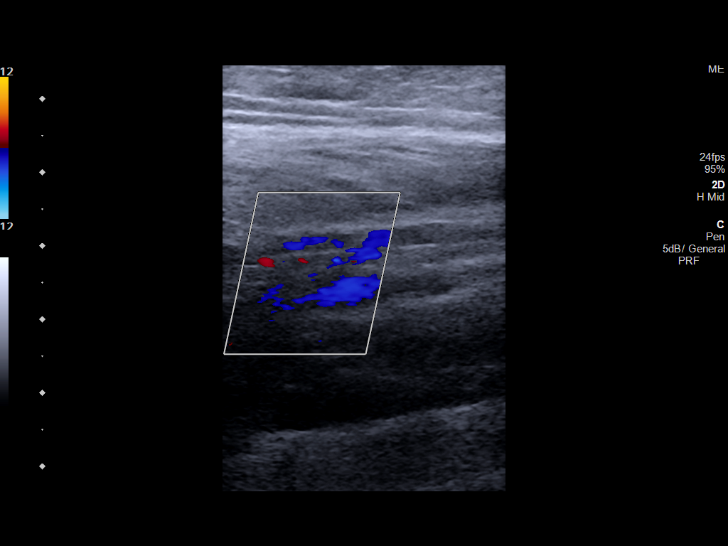
[im 46/51]
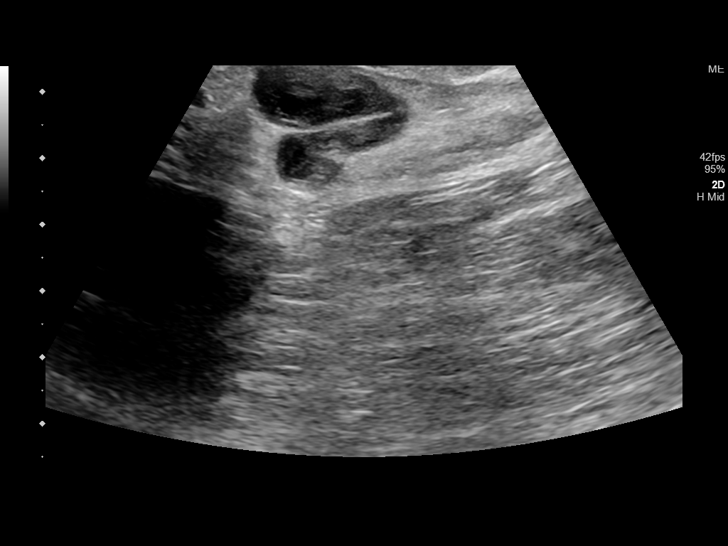
[im 51/51]
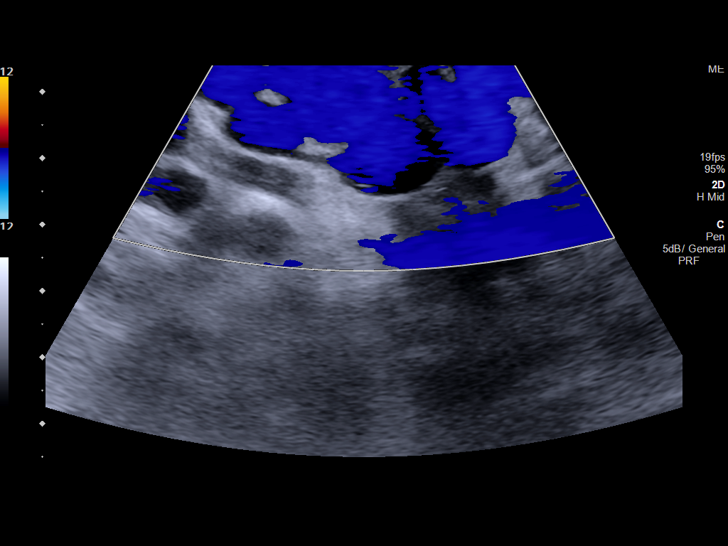

[13 of 24 positions shown; findings below may reference images not displayed]

FINDINGS: Contralateral Common Femoral Vein: Respiratory phasicity is normal
and symmetric with the symptomatic side. No evidence of thrombus.
Normal compressibility.

Common Femoral Vein: No evidence of thrombus. Normal
compressibility, respiratory phasicity and response to augmentation.

Saphenofemoral Junction: No evidence of thrombus. Normal
compressibility and flow on color Doppler imaging.

Profunda Femoral Vein: No evidence of thrombus. Normal
compressibility and flow on color Doppler imaging.

Femoral Vein: No evidence of thrombus. Normal compressibility,
respiratory phasicity and response to augmentation.

Popliteal Vein: No evidence of thrombus. Normal compressibility,
respiratory phasicity and response to augmentation.

Calf Veins: No evidence of thrombus. Normal compressibility and flow
on color Doppler imaging.

Superficial Great Saphenous Vein: There is a partially occlusive
thrombus which is partially compressible. This extends to the
varicosity in the medial left upper thigh.

Venous Reflux:  None.

Other Findings:  None.
IMPRESSION: No evidence of deep venous thrombosis. Partially occlusive
superficial thrombophlebitis of the left great saphenous vein.

These results were called by telephone at the time of interpretation
on 09/13/2018 at [DATE] to ITAMAR IGNACIO , who verbally
acknowledged these results.

## 2021-04-07 ENCOUNTER — Other Ambulatory Visit: Payer: Self-pay | Admitting: Family Medicine

## 2021-04-10 ENCOUNTER — Encounter: Payer: Self-pay | Admitting: Family Medicine

## 2021-04-10 ENCOUNTER — Ambulatory Visit: Payer: BC Managed Care – PPO | Admitting: Family Medicine

## 2021-04-10 DIAGNOSIS — E7800 Pure hypercholesterolemia, unspecified: Secondary | ICD-10-CM | POA: Diagnosis not present

## 2021-04-10 DIAGNOSIS — G8929 Other chronic pain: Secondary | ICD-10-CM

## 2021-04-10 DIAGNOSIS — F320 Major depressive disorder, single episode, mild: Secondary | ICD-10-CM

## 2021-04-10 DIAGNOSIS — M5442 Lumbago with sciatica, left side: Secondary | ICD-10-CM | POA: Diagnosis not present

## 2021-04-10 DIAGNOSIS — I10 Essential (primary) hypertension: Secondary | ICD-10-CM | POA: Diagnosis not present

## 2021-04-10 DIAGNOSIS — J989 Respiratory disorder, unspecified: Secondary | ICD-10-CM

## 2021-04-10 NOTE — Assessment & Plan Note (Signed)
Adequate control.  He will continue amlodipine 5 mg once daily. ?

## 2021-04-10 NOTE — Progress Notes (Signed)
?Brian Rumps, MD ?Phone: [phone] ? ?Brian Luna is a 66 y.o. male who presents today for f/u. ? ?HYPERTENSION ?Disease Monitoring ?Home BP Monitoring not checking Chest pain- no    Dyspnea- no ?Medications ?Compliance-  taking amlodipine.  Edema- no ?BMET ?   ?Component Value Date/Time  ? NA 142 07/25/2020 1534  ? K 4.9 07/25/2020 1534  ? CL 103 07/25/2020 1534  ? CO2 27 10/28/2019 1557  ? GLUCOSE 84 07/25/2020 1534  ? GLUCOSE 83 10/28/2019 1557  ? BUN 12 07/25/2020 1534  ? CREATININE 0.95 07/25/2020 1534  ? CREATININE 1.25 10/28/2019 1557  ? CALCIUM 10.1 07/25/2020 1534  ? ?HYPERLIPIDEMIA ?Symptoms ?Chest pain on exertion:  no   ?Medications: ?Compliance- taking crestor Right upper quadrant pain- no  Muscle aches- no ?Lipid Panel  ?   ?Component Value Date/Time  ? CHOL 133 10/10/2020 1630  ? TRIG 122.0 10/10/2020 1630  ? HDL 51.30 10/10/2020 1630  ? CHOLHDL 3 10/10/2020 1630  ? VLDL 24.4 10/10/2020 1630  ? LDLCALC 57 10/10/2020 1630  ? LDLCALC 55 10/28/2019 1557  ? LDLDIRECT 58.0 12/02/2016 0850  ? ?Respiratory illness: Patient reports he had a respiratory illness in January and February.  He reports receiving a couple rounds of antibiotics.  Reports he has been progressively improving.  He still has a cough in the morning when he wakes up and coughs up some brown-whitish mucus.  He notes during the daytime he is fine.  He notes over the last few days he has improved even further.  No wheezing.  He does have a history of smoking though quit 8 years ago.  He smoked for about 40 years. ? ?Depression: He denies depression.  He continues on Cymbalta 30 mg once daily. ? ?Chronic back pain: The patient saw pain management previously.  Notes he received some injections that were quite beneficial.  He notes he went to the [facility] to visit his daughter and walked a lot and his back started bothering him again.  He notes no numbness, weakness, or incontinence. ? ?Social History  ? ?Tobacco Use  ?Smoking Status  Former  ? Packs/day: 1.50  ? Years: 40.00  ? Pack years: 60.00  ? Types: Cigarettes  ? Quit date: 06/19/2012  ? Years since quitting: 8.8  ?Smokeless Tobacco Never  ? ? ?Current Outpatient Medications on File Prior to Visit  ?Medication Sig Dispense Refill  ? amLODipine (NORVASC) 5 MG tablet TAKE 1 TABLET BY MOUTH ONCE DAILY 90 tablet 1  ? aspirin 81 MG tablet Take 81 mg by mouth daily.    ? cyanocobalamin 2000 MCG tablet Take 2,000 mcg by mouth daily.    ? DULoxetine (CYMBALTA) 30 MG capsule TAKE 1 CAPSULE BY MOUTH ONCE DAILY FOR 7DAYS THEN 2 CAPSULES DAILY 180 capsule 1  ? esomeprazole (NEXIUM) 20 MG capsule Take 20 mg by mouth daily.     ? Magnesium 500 MG TABS Take 500 mg by mouth daily.    ? meclizine (ANTIVERT) 25 MG tablet TAKE 1/2 TABLET BY MOUTH 3 TIMES DAILY AS NEEDED 30 tablet 0  ? naproxen sodium (ALEVE) 220 MG tablet Take 440 mg by mouth daily.    ? niacin 500 MG tablet Take 500 mg by mouth daily.     ? Potassium 99 MG TABS Take 99 mg by mouth daily.    ? rosuvastatin (CRESTOR) 20 MG tablet TAKE 1 TABLET BY MOUTH ONCE EVERY EVENING 90 tablet 3  ? ?No current facility-administered  medications on file prior to visit.  ? ? ? ?ROS see history of present illness ? ?Objective ? ?Physical Exam ?Vitals:  ? 04/10/21 1600  ?BP: 130/80  ?Pulse: 97  ?Temp: 98.2 ?F (36.8 ?C)  ?SpO2: 96%  ? ? ?BP Readings from Last 3 Encounters:  ?04/10/21 130/80  ?10/10/20 121/70  ?08/28/20 (!) 134/92  ? ?Wt Readings from Last 3 Encounters:  ?04/10/21 196 lb 9.6 oz (89.2 kg)  ?10/10/20 198 lb 3.2 oz (89.9 kg)  ?08/28/20 192 lb (87.1 kg)  ? ? ?Physical Exam ?Constitutional:   ?   General: He is not in acute distress. ?   Appearance: He is not diaphoretic.  ?Cardiovascular:  ?   Rate and Rhythm: Normal rate and regular rhythm.  ?   Heart sounds: Normal heart sounds.  ?Pulmonary:  ?   Effort: Pulmonary effort is normal.  ?   Breath sounds: Normal breath sounds.  ?Skin: ?   General: Skin is warm and dry.  ?Neurological:  ?   Mental  Status: He is alert.  ?   Comments: 5/5 strength bilateral quads, hamstrings, plantarflexion, and dorsiflexion, sensation to light touch intact bilateral lower extremities  ? ? ? ?Assessment/Plan: Please see individual problem list. ? ?Problem List Items Addressed This Visit   ? ? 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) (Chronic)  ?  I encouraged him to follow-up with pain management to see if further injections would be warranted. ?  ?  ? Depression, major, single episode, mild ([place])  ?  Asymptomatic.  He will continue Cymbalta 30 mg once daily. ?  ?  ? Elevated LDL cholesterol level  ?  Continue Crestor 20 mg once daily. ?  ?  ? Hypertension  ?  Adequate control.  He will continue amlodipine 5 mg once daily. ?  ?  ? Respiratory illness  ?  Patient has improved quite a bit.  Given his improvement I encouraged him to monitor this for another week or so.  If it does not resolve by this time next week he will let me know and we will consider further evaluation. ?  ?  ? ? ?Return in about 1 month (around 05/10/2021) for CPE. ? ?This visit occurred during the SARS-CoV-2 public health emergency.  Safety protocols were in place, including screening questions prior to the visit, additional usage of staff PPE, and extensive cleaning of exam room while observing appropriate contact time as indicated for disinfecting solutions.  ? ? ?Brian Rumps, MD ?[place] ? ?

## 2021-04-10 NOTE — Assessment & Plan Note (Signed)
Asymptomatic.  He will continue Cymbalta 30 mg once daily. ?

## 2021-04-10 NOTE — Assessment & Plan Note (Signed)
Continue Crestor 20 mg once daily. ?

## 2021-04-10 NOTE — Assessment & Plan Note (Signed)
Patient has improved quite a bit.  Given his improvement I encouraged him to monitor this for another week or so.  If it does not resolve by this time next week he will let me know and we will consider further evaluation. ?

## 2021-04-10 NOTE — Patient Instructions (Signed)
Nice to see you. ?Please monitor your cough and if it does not resolve over the next week or so please let me know. ?Please contact the pain clinic regarding your back. ?

## 2021-04-10 NOTE — Assessment & Plan Note (Signed)
I encouraged him to follow-up with pain management to see if further injections would be warranted. ?

## 2021-06-07 ENCOUNTER — Ambulatory Visit (INDEPENDENT_AMBULATORY_CARE_PROVIDER_SITE_OTHER): Payer: BC Managed Care – PPO | Admitting: Family Medicine

## 2021-06-07 ENCOUNTER — Encounter: Payer: Self-pay | Admitting: Family Medicine

## 2021-06-07 VITALS — BP 115/80 | HR 86 | Ht 67.0 in | Wt 197.6 lb

## 2021-06-07 DIAGNOSIS — E7800 Pure hypercholesterolemia, unspecified: Secondary | ICD-10-CM

## 2021-06-07 DIAGNOSIS — Z0000 Encounter for general adult medical examination without abnormal findings: Secondary | ICD-10-CM

## 2021-06-07 MED ORDER — DULOXETINE HCL 60 MG PO CPEP: 60.0000 mg | ORAL_CAPSULE | Freq: Every day | ORAL | 3 refills | Status: DC

## 2021-06-07 NOTE — Assessment & Plan Note (Signed)
Physical exam completed.  Form filled out for patient for his insurance.  I encouraged continued healthy diet and exercise.  Discussed reducing soda intake.  Colon cancer screening is up-to-date.  He will continue to follow with urology for his prostate cancer.  We will contact his pharmacy to get his vaccine records.  Lab work as outlined.

## 2021-06-07 NOTE — Patient Instructions (Signed)
Nice to see you. Please continue to monitor your diet.  Please reduce your soda intake. We will contact you with your labs.

## 2021-06-07 NOTE — Progress Notes (Signed)
Tommi Rumps, MD Phone: [phone]  Brian Luna is a 66 y.o. male who presents today for CPE.  Diet: Patient reports has been working on his diet.  He has a special K bar for breakfast and then a banana and another special K bar for a snack.  He will have peaches and yogurt sandwich at lunch.  A lot of vegetable and meat at supper.  He drinks 6 diet sodas daily. Exercise: His job is very active. Colonoscopy: 12/19/2016 with 10-year recall Prostate cancer screening: Patient had prostate cancer and he follows with urology for this. Family history-  Prostate cancer: no  Colon cancer: no Vaccines-   Flu: out of season  Tetanus: UTD  Shingles: UTD  COVID19: UTD  Pneumonia: UTD HIV screening: declined previously Hep C Screening: UTD Tobacco use: no Alcohol use: occasional Illicit Drug use: no Dentist: dentures Ophthalmology: yes   Active Ambulatory Problems    Diagnosis Date Noted   Vertigo 02/13/2015   Elevated LDL cholesterol level 03/19/2015   GERD (gastroesophageal reflux disease) 06/01/2015   Obesity 06/01/2015   Routine general medical examination at a health care facility 08/04/2016   History of prostate cancer 10/19/2017   Skin tag 10/22/2018   Right knee pain 10/22/2018   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) 06/20/2019   Hypertension 06/20/2019   Depression, major, single episode, mild (HCC) 06/27/2019   Chronic venous insufficiency 06/27/2019   Thoracic back pain 06/27/2019   Leukocytosis 07/18/2019   Elevated vitamin B12 level 07/18/2019   Neurogenic claudication 04/27/2020   Positional hand numbness 04/27/2020   Chronic pain syndrome 07/25/2020   Pharmacologic therapy 07/25/2020   Disorder of skeletal system 07/25/2020   Problems influencing health status 07/25/2020   DDD (degenerative disc disease), lumbosacral 07/25/2020   DJD (degenerative joint disease), lumbosacral 07/25/2020   Chronic lower extremity pain (Left) 07/25/2020    Numbness of foot (intermittent) (Left) 07/25/2020   Abnormal MRI, lumbar spine (05/12/2020) 07/25/2020   Displacement of lumbar intervertebral disc 07/25/2020   Foraminal stenosis of lumbar region (Right: L4-5, L5-S1) (Left: L4-5) 07/25/2020   Lumbar lateral recess stenosis (Right: L3-4, L4-5) 07/25/2020   Lumbar facet arthropathy (Multilevel) (Bilateral) 07/25/2020   Levoscoliosis of lumbar spine 07/25/2020   Lumbar facet joint syndrome (Bilateral) 07/31/2020   Spondylosis without myelopathy or radiculopathy, lumbosacral region 07/31/2020   Chronic low back pain (1ry area of Pain) (Bilateral) w/o sciatica 07/31/2020   Chronic hip pain (2ry area of Pain) (Bilateral) 07/31/2020   Chronic lower extremity pain (3ry area of Pain) (Bilateral) 07/31/2020   Respiratory illness 04/10/2021   Resolved Ambulatory Problems    Diagnosis Date Noted   Chest pain 02/13/2015   Past Medical History:  Diagnosis Date   Blood in stool    Chickenpox    Depression    History of kidney stones    Prostate cancer ([place])    Varicose veins     Family History  Problem Relation Age of Onset   Arthritis Other    Heart attack Father 11   Heart attack Paternal Grandfather 38    Social History   Socioeconomic History   Marital status: Married    Spouse name: Not on file   Number of children: Not on file   Years of education: Not on file   Highest education level: Not on file  Occupational History   Not on file  Tobacco Use   Smoking status: Former    Packs/day: 1.50  Years: 40.00    Pack years: 60.00    Types: Cigarettes    Quit date: 06/19/2012    Years since quitting: 8.9   Smokeless tobacco: Never  Vaping Use   Vaping Use: Never used  Substance and Sexual Activity   Alcohol use: Yes    Alcohol/week: 0.0 standard drinks    Comment: Occasional   Drug use: No   Sexual activity: Not on file  Other Topics Concern   Not on file  Social History Narrative   Not on file   Social Determinants  of Health   Financial Resource Strain: Not on file  Food Insecurity: Not on file  Transportation Needs: Not on file  Physical Activity: Not on file  Stress: Not on file  Social Connections: Not on file  Intimate Partner Violence: Not on file    ROS  General:  Negative for nexplained weight loss, fever Skin: Negative for new or changing mole, sore that won't heal HEENT: Negative for trouble hearing, trouble seeing, ringing in ears, mouth sores, hoarseness, change in voice, dysphagia. CV:  Negative for chest pain, dyspnea, edema, palpitations Resp: Negative for cough, dyspnea, hemoptysis GI: Negative for nausea, vomiting, diarrhea, constipation, abdominal pain, melena, hematochezia. GU: Negative for dysuria, incontinence, urinary hesitance, hematuria, vaginal or penile discharge, polyuria, sexual difficulty, lumps in testicle or breasts MSK: Negative for muscle cramps or aches, joint pain or swelling Neuro: Negative for headaches, weakness, numbness, dizziness, passing out/fainting Psych: Negative for depression, anxiety, memory problems  Objective  Physical Exam Vitals:   06/07/21 1556  BP: 115/80  Pulse: 86  SpO2: 97%    BP Readings from Last 3 Encounters:  06/07/21 115/80  04/10/21 130/80  10/10/20 121/70   Wt Readings from Last 3 Encounters:  06/07/21 197 lb 9.6 oz (89.6 kg)  04/10/21 196 lb 9.6 oz (89.2 kg)  10/10/20 198 lb 3.2 oz (89.9 kg)    Physical Exam Constitutional:      General: He is not in acute distress.    Appearance: He is not diaphoretic.  HENT:     Head: Normocephalic and atraumatic.  Cardiovascular:     Rate and Rhythm: Normal rate and regular rhythm.     Heart sounds: Normal heart sounds.  Pulmonary:     Effort: Pulmonary effort is normal.     Breath sounds: Normal breath sounds.  Abdominal:     General: Bowel sounds are normal. There is no distension.     Palpations: Abdomen is soft.     Tenderness: There is no abdominal tenderness.   Musculoskeletal:     Right lower leg: No edema.     Left lower leg: No edema.  Lymphadenopathy:     Cervical: No cervical adenopathy.  Skin:    General: Skin is warm and dry.  Neurological:     Mental Status: He is alert.  Psychiatric:        Mood and Affect: Mood normal.     Assessment/Plan:   Problem List Items Addressed This Visit     Elevated LDL cholesterol level (Chronic)   Relevant Orders   Comp Met (CMET)   Lipid panel   Routine general medical examination at a health care facility - Primary    Physical exam completed.  Form filled out for patient for his insurance.  I encouraged continued healthy diet and exercise.  Discussed reducing soda intake.  Colon cancer screening is up-to-date.  He will continue to follow with urology for his prostate cancer.  We will contact his pharmacy to get his vaccine records.  Lab work as outlined.        Return in about 6 months (around 12/07/2021) for Hypertension.   Tommi Rumps, MD [place]

## 2021-06-08 LAB — COMPREHENSIVE METABOLIC PANEL WITH GFR
ALT: 25 IU/L (ref 0–44)
AST: 25 IU/L (ref 0–40)
Albumin/Globulin Ratio: 1.7 (ref 1.2–2.2)
Albumin: 4.2 g/dL (ref 3.8–4.8)
Alkaline Phosphatase: 86 IU/L (ref 44–121)
BUN/Creatinine Ratio: 17 (ref 10–24)
BUN: 16 mg/dL (ref 8–27)
Bilirubin Total: 0.4 mg/dL (ref 0.0–1.2)
CO2: 25 mmol/L (ref 20–29)
Calcium: 9.5 mg/dL (ref 8.6–10.2)
Chloride: 106 mmol/L (ref 96–106)
Creatinine, Ser: 0.96 mg/dL (ref 0.76–1.27)
Globulin, Total: 2.5 g/dL (ref 1.5–4.5)
Glucose: 87 mg/dL (ref 70–99)
Potassium: 4.4 mmol/L (ref 3.5–5.2)
Sodium: 143 mmol/L (ref 134–144)
Total Protein: 6.7 g/dL (ref 6.0–8.5)
eGFR: 88 mL/min/1.73

## 2021-06-08 LAB — LIPID PANEL
Chol/HDL Ratio: 2.5 ratio (ref 0.0–5.0)
Cholesterol, Total: 126 mg/dL (ref 100–199)
HDL: 50 mg/dL
LDL Chol Calc (NIH): 41 mg/dL (ref 0–99)
Triglycerides: 224 mg/dL — ABNORMAL HIGH (ref 0–149)
VLDL Cholesterol Cal: 35 mg/dL (ref 5–40)

## 2021-06-18 ENCOUNTER — Other Ambulatory Visit: Payer: Self-pay | Admitting: Family Medicine

## 2021-07-19 ENCOUNTER — Other Ambulatory Visit: Payer: Self-pay | Admitting: Family Medicine

## 2021-07-31 NOTE — Telephone Encounter (Signed)
Patient states he is returning our call.  I read Fulton Mole, CMA's message to patient.  Patient states he believes he has had a second shingles shot, but he isn't sure.  Patient states he will check with his wife when he gets home.

## 2021-07-31 NOTE — Progress Notes (Signed)
I called and LVM and I sent a mychart message.  Malaak Stach,cma

## 2021-09-11 ENCOUNTER — Other Ambulatory Visit: Payer: Self-pay | Admitting: Family Medicine

## 2021-11-02 ENCOUNTER — Other Ambulatory Visit: Payer: Self-pay

## 2021-11-02 ENCOUNTER — Emergency Department: Payer: BC Managed Care – PPO

## 2021-11-02 ENCOUNTER — Emergency Department
Admission: EM | Admit: 2021-11-02 | Discharge: 2021-11-03 | Disposition: A | Discharge status: Home / Self Care | Payer: BC Managed Care – PPO | Attending: Emergency Medicine | Admitting: Emergency Medicine

## 2021-11-02 DIAGNOSIS — W01198A Fall on same level from slipping, tripping and stumbling with subsequent striking against other object, initial encounter: Secondary | ICD-10-CM | POA: Insufficient documentation

## 2021-11-02 DIAGNOSIS — W19XXXA Unspecified fall, initial encounter: Secondary | ICD-10-CM

## 2021-11-02 DIAGNOSIS — S0990XA Unspecified injury of head, initial encounter: Secondary | ICD-10-CM | POA: Diagnosis present

## 2021-11-02 DIAGNOSIS — S0101XA Laceration without foreign body of scalp, initial encounter: Secondary | ICD-10-CM

## 2021-11-02 DIAGNOSIS — S060X0A Concussion without loss of consciousness, initial encounter: Secondary | ICD-10-CM | POA: Diagnosis not present

## 2021-11-02 NOTE — ED Provider Notes (Signed)
[facility] Provider Note  Patient Contact: 10:33 PM (approximate)   History   Fall   HPI  Brian Luna is a 66 y.o. male  Who presents the emergency department complaining of a head injury.  Patient presents with his family after falling and hitting his head.  According to the family, patient had been outside, got slightly overheated but had also been drinking alcohol.  Patient took a hot shower, came out stating that he felt dizzy and while his wife was trying to help him to bed he stumbled, fell back and hit his head.  There was no loss of consciousness before or after hitting his head.  Patient does have some short-term memory loss of events leading up to his fall.  Patient is answering other questions appropriately at this time.  He is alert and oriented x4.  No history of previous head injuries to include skull fracture or intracranial hemorrhage.  Patient also was complaining of posterior left shoulder pain where he hit the dresser.  Full range of motion to both upper extremity.  No unilateral weakness, blurred vision, slurred speech.  No chest pain, abdominal pain.  No other musculoskeletal complaint at this time.     Physical Exam   Triage Vital Signs: ED Triage Vitals  Enc Vitals Group     BP 11/02/21 2156 (!) 142/95     Pulse Rate 11/02/21 2156 74     Resp 11/02/21 2156 18     Temp 11/02/21 2156 98.2 F (36.8 C)     Temp Source 11/02/21 2156 Oral     SpO2 11/02/21 2156 99 %     Weight 11/02/21 2159 200 lb (90.7 kg)     Height 11/02/21 2159 '5\' 7"'$  (1.702 m)     Head Circumference --      Peak Flow --      Pain Score 11/02/21 2158 0     Pain Loc --      Pain Edu? --      Excl. in [place]? --     Most recent vital signs: Vitals:   11/02/21 2156  BP: (!) 142/95  Pulse: 74  Resp: 18  Temp: 98.2 F (36.8 C)  SpO2: 99%     General: Alert and in no acute distress. Eyes:  PERRL. EOMI. Head: 4 cm laceration noted to the occipital skull with no  active bleeding.  No visible foreign body.  No other visible signs of trauma to the head or face.  No tenderness to palpation of the osseous structures of the skull or face.  No palpable abnormality such as crepitus.  No battle signs, raccoon eyes, serosanguineous fluid drainage from the ears or nares.  Neck: No stridor. No cervical spine tenderness to palpation.  Cardiovascular:  Good peripheral perfusion Respiratory: Normal respiratory effort without tachypnea or retractions. Lungs CTAB Musculoskeletal: Full range of motion to all extremities.  Visualization of the left shoulder reveals an abrasion to the posterior left shoulder around the scapular region.  No lacerations.  Tender along the scapular area without palpable abnormality.  No other tenderness to the ribs or spine. Neurologic:  No gross focal neurologic deficits are appreciated.  Cranial nerves II through XII grossly intact.  Negative Romberg's and pronator drift. Skin:   No rash noted Other:   ED Results / Procedures / Treatments   Labs (all labs ordered are listed, but only abnormal results are displayed) Labs Reviewed - No data to display  EKG    RADIOLOGY  I personally viewed, evaluated, and interpreted these images as part of my medical decision making, as well as reviewing the written report by the radiologist.  ED Provider Interpretation: No acute traumatic findings concerning for skull fracture, head bleed, shoulder fracture or dislocation.   CT Head Wo Contrast  Result Date: 11/02/2021 CLINICAL DATA:  Head trauma, moderate-severe.  Fall. EXAM: CT HEAD WITHOUT CONTRAST TECHNIQUE: Contiguous axial images were obtained from the base of the skull through the vertex without intravenous contrast. RADIATION DOSE REDUCTION: This exam was performed according to the departmental dose-optimization program which includes automated exposure control, adjustment of the mA and/or kV according to patient size and/or use of  iterative reconstruction technique. COMPARISON:  None Available. FINDINGS: Brain: No acute intracranial abnormality. Specifically, no hemorrhage, hydrocephalus, mass lesion, acute infarction, or significant intracranial injury. Vascular: No hyperdense vessel or unexpected calcification. Skull: No acute calvarial abnormality. Sinuses/Orbits: No acute findings Other: None IMPRESSION: Normal study. Electronically Signed   By: Rolm Baptise M.D.   On: 11/02/2021 23:39   CT Cervical Spine Wo Contrast  Result Date: 11/02/2021 CLINICAL DATA:  Neck trauma (Age >= 65y).  Fall EXAM: CT CERVICAL SPINE WITHOUT CONTRAST TECHNIQUE: Multidetector CT imaging of the cervical spine was performed without intravenous contrast. Multiplanar CT image reconstructions were also generated. RADIATION DOSE REDUCTION: This exam was performed according to the departmental dose-optimization program which includes automated exposure control, adjustment of the mA and/or kV according to patient size and/or use of iterative reconstruction technique. COMPARISON:  None Available. FINDINGS: Alignment: Normal Skull base and vertebrae: No acute fracture. No primary bone lesion or focal pathologic process. Soft tissues and spinal canal: No prevertebral fluid or swelling. No visible canal hematoma. Disc levels: Diffuse degenerative disc disease with disc space narrowing and spurring. No disc herniation. Upper chest: No acute findings Other: None IMPRESSION: No acute bony abnormality. Electronically Signed   By: Rolm Baptise M.D.   On: 11/02/2021 23:38   DG Shoulder Left  Result Date: 11/02/2021 CLINICAL DATA:  Fall. EXAM: LEFT SHOULDER - 2+ VIEW COMPARISON:  None Available. FINDINGS: There is no evidence of fracture or dislocation. There are mild degenerative changes of the acromioclavicular joint. Soft tissues are unremarkable. IMPRESSION: Negative. Electronically Signed   By: Ronney Asters M.D.   On: 11/02/2021 23:16    PROCEDURES:  Critical  Care performed: No  ..Laceration Repair  Date/Time: 11/02/2021 11:59 PM  Performed by: Darletta Moll, PA-C Authorized by: Darletta Moll, PA-C   Consent:    Consent obtained:  Verbal   Consent given by:  Patient and spouse   Risks, benefits, and alternatives were discussed: yes     Risks discussed:  Infection, pain and poor wound healing Universal protocol:    Procedure explained and questions answered to patient or proxy's satisfaction: yes     Immediately prior to procedure, a time out was called: yes     Patient identity confirmed:  Verbally with patient Anesthesia:    Anesthesia method:  None Laceration details:    Location:  Scalp   Scalp location:  Occipital   Length (cm):  6 Exploration:    Hemostasis achieved with:  Direct pressure   Imaging obtained comment:  CT Scan   Wound extent: no foreign body, no nerve damage, no tendon damage and no underlying fracture     Contaminated: yes   Treatment:    Area cleansed with:  Shur-Clens   Amount of cleaning:  Standard Skin repair:    Repair method:  Staples   Number of staples:  4 Approximation:    Approximation:  Close Repair type:    Repair type:  Simple Post-procedure details:    Dressing:  Open (no dressing)   Procedure completion:  Tolerated well, no immediate complications     MEDICATIONS ORDERED IN ED: Medications - No data to display   IMPRESSION / MDM / [place] / ED COURSE  I reviewed the triage vital signs and the nursing notes.                              Differential diagnosis includes, but is not limited to, head injury, concussion, skull fracture, intracranial hemorrhage  Patient's presentation is most consistent with primaryacute presentation with potential threat to life or bodily function.   Patient's diagnosis is consistent with fall, head injury.  Patient presents emergency department with a fall.  Patient had been out in the heat, had also been consuming  alcohol, took a hot shower.  As he was going to bed with his wife, states that he got a little dizzy, lost his balance fell and hit his head against a dresser.  No loss of consciousness anytime.  Patient has short-term memory issues leading up to his fall.  Patient was able to complete cranial nerve testing at this time.  He did sustain a laceration to the occipital skull.  This was closed with 4 staples as described above.  Imaging was negative for skull fracture or intracranial hemorrhage..  Given the patient's symptoms I do feel that he has a concussion.  We discussed concussion symptoms, postconcussive syndrome and returned to work and driving precautions.  The patient should have Tylenol Motrin for any headache.  Wound care instructions regarding laceration are discussed.  Follow-up primary care as needed and in 7 to 10 days for staple removal.  Patient is given ED precautions to return to the ED for any worsening or new symptoms.        FINAL CLINICAL IMPRESSION(S) / ED DIAGNOSES   Final diagnoses:  Fall, initial encounter  Laceration of scalp, initial encounter  Concussion without loss of consciousness, initial encounter     Rx / DC Orders   ED Discharge Orders     None        Note:  This document was prepared using Dragon voice recognition software and may include unintentional dictation errors.   Darletta Moll, PA-C 11/03/21 0001    Rada Hay, MD 11/04/21 2306

## 2021-11-02 NOTE — ED Triage Notes (Signed)
Pt arrived via EMS with c/o fall at home, laceration to back of head approx 2 inches in length--bleeding controlled.  ETOH on board.  Pt and family both deny LOC with fall.

## 2021-11-03 NOTE — ED Notes (Signed)
DC instructions given verbally and in writing, understanding voiced.  Pt taken via wheelchair to POV with wife and daughter at side.

## 2021-11-11 ENCOUNTER — Encounter: Payer: Self-pay | Admitting: Family

## 2021-11-11 ENCOUNTER — Ambulatory Visit: Payer: BC Managed Care – PPO | Admitting: Family

## 2021-11-11 VITALS — BP 118/80 | HR 77 | Temp 97.7°F | Ht 67.0 in | Wt 221.0 lb

## 2021-11-11 DIAGNOSIS — Z4802 Encounter for removal of sutures: Secondary | ICD-10-CM | POA: Diagnosis not present

## 2021-11-11 DIAGNOSIS — I10 Essential (primary) hypertension: Secondary | ICD-10-CM

## 2021-11-11 NOTE — Assessment & Plan Note (Signed)
Reviewed ED visit. 4 sutures removed without complication.  No bleeding.  no symptoms of infection.  No symptoms of postconcussive syndrome.

## 2021-11-11 NOTE — Assessment & Plan Note (Signed)
Chronic, excellent control.  Counseled patient in regards to being very careful with dehydration,  hot showers particularly as he is taking antihypertensive medication.  He is pleased with amlodipine 5 mg.  Advised him to remain vigilant and most certainly if dizziness or syncopal episodes were to recur, this would need to be discussed in greater detail with PCP

## 2021-11-11 NOTE — Progress Notes (Signed)
Subjective:    Patient ID: Brian Luna, male    DOB: Dec 05, 1955, 66 y.o.   MRN: [id number]  CC: Brian Luna is a 66 y.o. male who presents today for an acute visit.    HPI: 10 days after fall  Here for staples removal   Feels well today.  No complaints.  Denies headache, vision changes confusion, decreased concentration     Status post fall and laceration of the scalp without LOC, left shoulder pain. Seen [facility] ED 11/02/2021.  Short-term memory loss, felt to have concussion.   CT head, cervical spine obtained without acute fracture.  DDD  with disc base narrowing and spurring noted.  Normal CT head.  Left shoulder x-ray without evidence of fracture or dislocation.  Mild degenerative changes of the [facility] joint   laceration 4 staples  Hypertension-compliant with amlodipine 5 mg.  Denies dizziness.  He is overall pleased with this dose HISTORY:  Past Medical History:  Diagnosis Date   Blood in stool    Chickenpox    Depression    GERD (gastroesophageal reflux disease)    History of kidney stones    Prostate cancer ([place])    Varicose veins    With clot of varicose vein   Vertigo    Past Surgical History:  Procedure Laterality Date   CYSTOSCOPY WITH LITHOLAPAXY N/A 06/24/2016   Procedure: CYSTOSCOPY WITH LITHOLAPAXY WITH HOLMIUM LASER;  Surgeon: Royston Cowper, MD;  Location: [facility] ORS;  Service: Urology;  Laterality: N/A;   HOLMIUM LASER APPLICATION N/A 7/98/9211   Procedure: HOLMIUM LASER APPLICATION;  Surgeon: Royston Cowper, MD;  Location: [facility] ORS;  Service: Urology;  Laterality: N/A;   MULTIPLE TOOTH EXTRACTIONS     PROSTATE SURGERY     Thrombosed hemorrhoids     VARICOSE VEIN SURGERY     Family History  Problem Relation Age of Onset   Arthritis Other    Heart attack Father 7   Heart attack Paternal Grandfather 28    Allergies: Patient has no known allergies. Current Outpatient Medications on File Prior to Visit  Medication Sig Dispense Refill   amLODipine  (NORVASC) 5 MG tablet TAKE 1 TABLET BY MOUTH ONCE DAILY 90 tablet 1   aspirin 81 MG tablet Take 81 mg by mouth daily.     cyanocobalamin 2000 MCG tablet Take 2,000 mcg by mouth daily.     DULoxetine (CYMBALTA) 60 MG capsule Take 1 capsule (60 mg total) by mouth daily. 90 capsule 3   esomeprazole (NEXIUM) 20 MG capsule Take 20 mg by mouth daily.      Magnesium 500 MG TABS Take 500 mg by mouth daily.     meclizine (ANTIVERT) 25 MG tablet TAKE 1/2 TABLET BY MOUTH 3 TIMES DAILY AS NEEDED 30 tablet 0   naproxen sodium (ALEVE) 220 MG tablet Take 440 mg by mouth daily.     niacin 500 MG tablet Take 500 mg by mouth daily.      Potassium 99 MG TABS Take 99 mg by mouth daily.     rosuvastatin (CRESTOR) 20 MG tablet TAKE 1 TABLET BY MOUTH ONCE EVERY EVENING 90 tablet 3   No current facility-administered medications on file prior to visit.    Social History   Tobacco Use   Smoking status: Former    Packs/day: 1.50    Years: 40.00    Total pack years: 60.00    Types: Cigarettes    Quit date: 06/19/2012    Years  since quitting: 9.4   Smokeless tobacco: Never  Vaping Use   Vaping Use: Never used  Substance Use Topics   Alcohol use: Yes    Alcohol/week: 0.0 standard drinks of alcohol    Comment: Occasional   Drug use: No    Review of Systems  Constitutional:  Negative for chills and fever.  Respiratory:  Negative for cough.   Cardiovascular:  Negative for chest pain and palpitations.  Gastrointestinal:  Negative for nausea and vomiting.  Neurological:  Negative for dizziness and headaches.      Objective:    BP 118/80 (BP Location: Left Arm, Patient Position: Sitting, Cuff Size: Normal)   Pulse 77   Temp 97.7 F (36.5 C) (Oral)   Ht '5\' 7"'$  (1.702 m)   Wt 221 lb (100.2 kg)   SpO2 95%   BMI 34.61 kg/m    Physical Exam Vitals reviewed.  Constitutional:      Appearance: He is well-developed.  HENT:     Head:      Comments: 4 staples removed.  Laceration well approximated and  scabbed.  No bleeding, purulent discharge, erythema or increased heat.  Cardiovascular:     Rate and Rhythm: Regular rhythm.     Heart sounds: Normal heart sounds.  Pulmonary:     Effort: Pulmonary effort is normal. No respiratory distress.     Breath sounds: Normal breath sounds. No wheezing, rhonchi or rales.  Skin:    General: Skin is warm and dry.  Neurological:     Mental Status: He is alert.  Psychiatric:        Speech: Speech normal.        Behavior: Behavior normal.        Assessment & Plan:   Problem List Items Addressed This Visit       Cardiovascular and Mediastinum   Hypertension - Primary    Chronic, excellent control.  Counseled patient in regards to being very careful with dehydration,  hot showers particularly as he is taking antihypertensive medication.  He is pleased with amlodipine 5 mg.  Advised him to remain vigilant and most certainly if dizziness or syncopal episodes were to recur, this would need to be discussed in greater detail with PCP        Other   Encounter for staple removal    Reviewed ED visit. 4 sutures removed without complication.  No bleeding.  no symptoms of infection.  No symptoms of postconcussive syndrome.          I am having Brian Luna maintain his cyanocobalamin, aspirin, niacin, Magnesium, Potassium, esomeprazole, naproxen sodium, meclizine, DULoxetine, rosuvastatin, and amLODipine.   No orders of the defined types were placed in this encounter.   Return precautions given.   Risks, benefits, and alternatives of the medications and treatment plan prescribed today were discussed, and patient expressed understanding.   Education regarding symptom management and diagnosis given to patient on AVS.  Continue to follow with Leone Haven, MD for routine health maintenance.   Evalee Jefferson and I agreed with plan.   Mable Paris, FNP

## 2021-12-09 ENCOUNTER — Ambulatory Visit (INDEPENDENT_AMBULATORY_CARE_PROVIDER_SITE_OTHER): Payer: BC Managed Care – PPO | Admitting: Family Medicine

## 2021-12-09 ENCOUNTER — Encounter: Payer: Self-pay | Admitting: Family Medicine

## 2021-12-09 VITALS — BP 128/82 | HR 98 | Temp 98.3°F | Ht 67.0 in | Wt 216.6 lb

## 2021-12-09 DIAGNOSIS — F320 Major depressive disorder, single episode, mild: Secondary | ICD-10-CM

## 2021-12-09 DIAGNOSIS — M5442 Lumbago with sciatica, left side: Secondary | ICD-10-CM

## 2021-12-09 DIAGNOSIS — Z87891 Personal history of nicotine dependence: Secondary | ICD-10-CM

## 2021-12-09 DIAGNOSIS — I10 Essential (primary) hypertension: Secondary | ICD-10-CM | POA: Diagnosis not present

## 2021-12-09 DIAGNOSIS — G8929 Other chronic pain: Secondary | ICD-10-CM

## 2021-12-09 DIAGNOSIS — E7800 Pure hypercholesterolemia, unspecified: Secondary | ICD-10-CM

## 2021-12-09 MED ORDER — AMLODIPINE BESYLATE 5 MG PO TABS: 5.0000 mg | ORAL_TABLET | Freq: Every day | ORAL | 1 refills | Status: DC

## 2021-12-09 MED ORDER — DULOXETINE HCL 60 MG PO CPEP: 60.0000 mg | ORAL_CAPSULE | Freq: Every day | ORAL | 3 refills | Status: DC

## 2021-12-09 NOTE — Assessment & Plan Note (Signed)
Refer for lung cancer screening given smoking history. Reports he quit about 10 years ago.

## 2021-12-09 NOTE — Progress Notes (Signed)
Brian Rumps, MD Phone: [phone]  Brian Luna is a 66 y.o. male who presents today for f/u.  Hypertension: patient does not check his pressures at home. He has a cuff, but finds that when he checks it at home, his back pain will elevate his pressures. He takes his amlodipine daily. He denies chest pain, shortness of breath, or edema in his lower extremities. He states that he currently doesn't exercise because he's active at his job, but plans to retire soon and enroll in a gym with his wife.   Hyperlipidemia: Patient reports that he takes his Crestor daily. He denies any muscle aches or stomach pain.   Depression: He takes his Cymbalta daily and states that it works well for him. 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   Chronic back and left hip pain: patient states he's had back pain for around 20 years. He says that his left hip pain flares to about a 5/10 when he has been standing for too long. He will take '800mg'$  of ibuprofen and that seems to help. He reports that he doesn't feel any pain sitting down or during standing. Currently he feels that his pain is not serious enough to see pain clinic for.  Social History   Tobacco Use  Smoking Status Former   Packs/day: 1.50   Years: 40.00   Total pack years: 60.00   Types: Cigarettes   Quit date: 06/19/2012   Years since quitting: 9.4  Smokeless Tobacco Never    Current Outpatient Medications on File Prior to Visit  Medication Sig Dispense Refill   aspirin 81 MG tablet Take 81 mg by mouth daily.     cyanocobalamin 2000 MCG tablet Take 2,000 mcg by mouth daily.     esomeprazole (NEXIUM) 20 MG capsule Take 20 mg by mouth daily.      Magnesium 500 MG TABS Take 500 mg by mouth daily.     meclizine (ANTIVERT) 25 MG tablet TAKE 1/2 TABLET BY MOUTH 3 TIMES DAILY AS NEEDED 30 tablet 0   naproxen sodium (ALEVE) 220 MG tablet Take 440 mg by mouth daily.     niacin 500 MG  tablet Take 500 mg by mouth daily.      Potassium 99 MG TABS Take 99 mg by mouth daily.     rosuvastatin (CRESTOR) 20 MG tablet TAKE 1 TABLET BY MOUTH ONCE EVERY EVENING 90 tablet 3   No current facility-administered medications on file prior to visit.     ROS see history of present illness  Objective  Vitals:   12/09/21 1622 12/09/21 1629  BP: 130/82 128/82  Pulse: 98   Temp: 98.3 F (36.8 C)   SpO2: 99%     BP Readings from Last 3 Encounters:  12/09/21 128/82  11/11/21 118/80  11/03/21 119/70   Wt Readings from Last 3 Encounters:  12/09/21 216 lb 9.6 oz (98.2 kg)  11/11/21 221 lb (100.2 kg)  11/02/21 200 lb (90.7 kg)    Physical Exam Constitutional:      Appearance: Normal appearance.  HENT:     Head: Normocephalic and atraumatic.  Cardiovascular:     Rate and Rhythm: Normal rate and regular rhythm.  Pulmonary:     Effort: Pulmonary effort is normal.     Breath sounds: Normal breath sounds.  Skin:    General: Skin is warm and dry.  Neurological:  Mental Status: He is alert.  Psychiatric:        Mood and Affect: Mood normal.    Assessment/Plan: Please see individual problem list.  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 Patient reports pain tolerable with ibuprofen. Encouraged him to follow-up with pain management if needed for worsening pain.       Relevant Medications   DULoxetine (CYMBALTA) 60 MG capsule   Elevated LDL cholesterol level (Chronic)    Continue Crestor '20mg'$  daily. Will recheck labs at follow-up visit.      Depression, major, single episode, mild ([place])    Well-controlled. Continue Cymbalta '60mg'$  daily.      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 Reports he quit about 10 years ago.       Relevant Orders   Ambulatory Referral for Lung Cancer Scre    Hypertension    Well-controlled. Continue taking amlodipine '5mg'$ . Advised patient to check his blood pressures at home a couple of times a week and send numbers to [place].      Relevant Medications   amLODipine (NORVASC) 5 MG tablet     Return in about 6 months (around 06/10/2022) for hypertension.   Marisa Cyphers, Medical Student [place]

## 2021-12-09 NOTE — Assessment & Plan Note (Addendum)
Well-controlled.  Continue Cymbalta 60 mg daily. ?

## 2021-12-09 NOTE — Assessment & Plan Note (Addendum)
Well-controlled. Continue taking amlodipine '5mg'$ . Advised patient to check his blood pressures at home a couple of times a week and send numbers to [place].

## 2021-12-09 NOTE — Progress Notes (Signed)
Patient seen along with medical student Zada Zhong.  I personally evaluated this patient along with the student, and verified all aspects of the history, physical exam, and medical decision making as documented by the student.  I agree with the student's documentation and have made all necessary edits.  Moroni Nester, MD  

## 2021-12-09 NOTE — Assessment & Plan Note (Signed)
Chronic. Patient reports pain tolerable with ibuprofen. Encouraged him to follow-up with pain management if needed for worsening pain.

## 2021-12-09 NOTE — Patient Instructions (Signed)
Please start checking your BP at home. If it is consistently >130/80 please let me know.

## 2021-12-09 NOTE — Assessment & Plan Note (Signed)
Continue Crestor '20mg'$  daily. Will recheck labs at follow-up visit.

## 2022-04-19 ENCOUNTER — Other Ambulatory Visit: Payer: Self-pay | Admitting: Family Medicine

## 2022-06-11 ENCOUNTER — Ambulatory Visit: Payer: BC Managed Care – PPO | Admitting: Family Medicine

## 2022-06-23 ENCOUNTER — Ambulatory Visit: Payer: BC Managed Care – PPO | Admitting: Family Medicine

## 2022-07-07 ENCOUNTER — Ambulatory Visit: Payer: BC Managed Care – PPO | Admitting: Family Medicine

## 2022-07-09 ENCOUNTER — Encounter: Payer: BC Managed Care – PPO | Admitting: Family Medicine

## 2022-08-06 ENCOUNTER — Encounter (INDEPENDENT_AMBULATORY_CARE_PROVIDER_SITE_OTHER): Payer: Self-pay

## 2022-08-20 ENCOUNTER — Ambulatory Visit (INDEPENDENT_AMBULATORY_CARE_PROVIDER_SITE_OTHER): Payer: BC Managed Care – PPO | Admitting: Family Medicine

## 2022-08-20 ENCOUNTER — Encounter: Payer: Self-pay | Admitting: Family Medicine

## 2022-08-20 VITALS — BP 120/80 | HR 88 | Temp 97.5°F | Ht 67.0 in | Wt 212.0 lb

## 2022-08-20 DIAGNOSIS — E6609 Other obesity due to excess calories: Secondary | ICD-10-CM | POA: Diagnosis not present

## 2022-08-20 DIAGNOSIS — Z0000 Encounter for general adult medical examination without abnormal findings: Secondary | ICD-10-CM

## 2022-08-20 DIAGNOSIS — Z6833 Body mass index (BMI) 33.0-33.9, adult: Secondary | ICD-10-CM | POA: Diagnosis not present

## 2022-08-20 DIAGNOSIS — Z87891 Personal history of nicotine dependence: Secondary | ICD-10-CM

## 2022-08-20 DIAGNOSIS — E7800 Pure hypercholesterolemia, unspecified: Secondary | ICD-10-CM

## 2022-08-20 DIAGNOSIS — Z8546 Personal history of malignant neoplasm of prostate: Secondary | ICD-10-CM

## 2022-08-20 MED ORDER — DULOXETINE HCL 60 MG PO CPEP: 60.0000 mg | ORAL_CAPSULE | Freq: Every day | ORAL | 3 refills | Status: DC

## 2022-08-20 MED ORDER — AMLODIPINE BESYLATE 5 MG PO TABS: 5.0000 mg | ORAL_TABLET | Freq: Every day | ORAL | 1 refills | Status: DC

## 2022-08-20 MED ORDER — MECLIZINE HCL 25 MG PO TABS: ORAL_TABLET | ORAL | 0 refills | Status: DC

## 2022-08-20 MED ORDER — ROSUVASTATIN CALCIUM 20 MG PO TABS: 20.0000 mg | ORAL_TABLET | Freq: Every day | ORAL | 1 refills | Status: DC

## 2022-08-20 NOTE — Assessment & Plan Note (Signed)
Physical exam completed.  Encouraged healthy diet and exercise.  Discussed reducing Gatorade intake.  PSA will be completed for prostate cancer screening.  He will be referred for lung cancer screening given smoking history.  He will get his flu vaccine later in the flu season.  Encouraged him to get the updated COVID vaccination when it comes out.  Lab work as outlined.

## 2022-08-20 NOTE — Progress Notes (Signed)
Marikay Alar, MD Phone: [phone]  Brian Luna is a 67 y.o. male who presents today for CPE.  Diet: Generally healthy, not too many sweets, junk food, or fried foods, mostly eating baked foods, some fruits and vegetables, has been drinking more Gatorade.  He did cut out soda. Exercise: Active 12 hours a day at work Colonoscopy: Up-to-date Prostate cancer screening: Due Family history-  Prostate cancer: Self, no other history and family  Colon cancer: No vaccines-   Flu: due this fall  Tetanus: UTD  Shingles: UTD - reports had at [facility]  COVID19: x4  Pneumonia: UTD - reports had at [facility] Hep C Screening: UTD Tobacco use: quit 10 years ago Alcohol use: 1-2 per week Illicit Drug use: no Dentist: no-has dentures Ophthalmology: no   Active Ambulatory Problems    Diagnosis Date Noted   Vertigo 02/13/2015   Elevated LDL cholesterol level 03/19/2015   GERD (gastroesophageal reflux disease) 06/01/2015   Obesity 06/01/2015   Routine general medical examination at a health care facility 08/04/2016   History of prostate cancer 10/19/2017   Skin tag 10/22/2018   Right knee pain 10/22/2018   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) 06/20/2019   Hypertension 06/20/2019   Depression, major, single episode, mild ([facility]) 06/27/2019   Chronic venous insufficiency 06/27/2019   Thoracic back pain 06/27/2019   Leukocytosis 07/18/2019   Elevated vitamin B12 level 07/18/2019   Neurogenic claudication 04/27/2020   Positional hand numbness 04/27/2020   Chronic pain syndrome 07/25/2020   Pharmacologic therapy 07/25/2020   Disorder of skeletal system 07/25/2020   Problems influencing health status 07/25/2020   DDD (degenerative disc disease), lumbosacral 07/25/2020   DJD (degenerative joint disease), lumbosacral 07/25/2020   Chronic lower extremity pain (Left) 07/25/2020   Numbness of foot (intermittent) (Left) 07/25/2020   Abnormal MRI, lumbar spine (05/12/2020) 07/25/2020    Displacement of lumbar intervertebral disc 07/25/2020   Foraminal stenosis of lumbar region (Right: L4-5, L5-S1) (Left: L4-5) 07/25/2020   Lumbar lateral recess stenosis (Right: L3-4, L4-5) 07/25/2020   Lumbar facet arthropathy (Multilevel) (Bilateral) 07/25/2020   Levoscoliosis of lumbar spine 07/25/2020   Lumbar facet joint syndrome (Bilateral) 07/31/2020   Spondylosis without myelopathy or radiculopathy, lumbosacral region 07/31/2020   Chronic low back pain (1ry area of Pain) (Bilateral) w/o sciatica 07/31/2020   Chronic hip pain (2ry area of Pain) (Bilateral) 07/31/2020   Chronic lower extremity pain (3ry area of Pain) (Bilateral) 07/31/2020   Respiratory illness 04/10/2021   Former smoker 12/09/2021   Resolved Ambulatory Problems    Diagnosis Date Noted   Chest pain 02/13/2015   Encounter for staple removal 11/11/2021   Past Medical History:  Diagnosis Date   Blood in stool    Chickenpox    Depression    History of kidney stones    Prostate cancer ([facility])    Varicose veins     Family History  Problem Relation Age of Onset   Arthritis Other    Heart attack Father 46   Heart attack Paternal Grandfather 28    Social History   Socioeconomic History   Marital status: Married    Spouse name: Not on file   Number of children: Not on file   Years of education: Not on file   Highest education level: Not on file  Occupational History   Not on file  Tobacco Use   Smoking status: Former    Current packs/day: 0.00    Average packs/day: 1.5 packs/day for 40.0  years (60.0 ttl pk-yrs)    Types: Cigarettes    Start date: 06/19/1972    Quit date: 06/19/2012    Years since quitting: 10.1   Smokeless tobacco: Never  Vaping Use   Vaping status: Never Used  Substance and Sexual Activity   Alcohol use: Yes    Alcohol/week: 0.0 standard drinks of alcohol    Comment: Occasional   Drug use: No   Sexual activity: Not on file  Other Topics Concern   Not on file  Social History  Narrative   Not on file   Social Determinants of Health   Financial Resource Strain: Not on file  Food Insecurity: Not on file  Transportation Needs: Not on file  Physical Activity: Not on file  Stress: Not on file  Social Connections: Not on file  Intimate Partner Violence: Not on file    ROS  General:  Negative for nexplained weight loss, fever Skin: Negative for new or changing mole, sore that won't heal HEENT: Negative for trouble hearing, trouble seeing, ringing in ears, mouth sores, hoarseness, change in voice, dysphagia. CV:  Negative for chest pain, dyspnea, edema, palpitations Resp: Negative for cough, dyspnea, hemoptysis GI: Negative for nausea, vomiting, diarrhea, constipation, abdominal pain, melena, hematochezia. GU: Negative for dysuria, incontinence, urinary hesitance, hematuria, vaginal or penile discharge, polyuria, sexual difficulty, lumps in testicle or breasts MSK: Negative for muscle cramps or aches, joint pain or swelling Neuro: Negative for headaches, weakness, numbness, dizziness, passing out/fainting Psych: Negative for depression, anxiety, memory problems  Objective  Physical Exam Vitals:   08/20/22 1341  BP: 120/80  Pulse: 88  Temp: (!) 97.5 F (36.4 C)  SpO2: 99%    BP Readings from Last 3 Encounters:  08/20/22 120/80  12/09/21 128/82  11/11/21 118/80   Wt Readings from Last 3 Encounters:  08/20/22 212 lb (96.2 kg)  12/09/21 216 lb 9.6 oz (98.2 kg)  11/11/21 221 lb (100.2 kg)    Physical Exam Constitutional:      General: He is not in acute distress.    Appearance: He is not diaphoretic.  HENT:     Head: Normocephalic and atraumatic.  Cardiovascular:     Rate and Rhythm: Normal rate and regular rhythm.     Heart sounds: Normal heart sounds.  Pulmonary:     Effort: Pulmonary effort is normal.     Breath sounds: Normal breath sounds.  Abdominal:     General: Bowel sounds are normal. There is no distension.     Palpations:  Abdomen is soft.     Tenderness: There is no abdominal tenderness.  Musculoskeletal:     Right lower leg: No edema.     Left lower leg: No edema.  Lymphadenopathy:     Cervical: No cervical adenopathy.  Skin:    General: Skin is warm and dry.  Neurological:     Mental Status: He is alert.  Psychiatric:        Mood and Affect: Mood normal.      Assessment/Plan:   Routine general medical examination at a health care facility Assessment & Plan: Physical exam completed.  Encouraged healthy diet and exercise.  Discussed reducing Gatorade intake.  PSA will be completed for prostate cancer screening.  He will be referred for lung cancer screening given smoking history.  He will get his flu vaccine later in the flu season.  Encouraged him to get the updated COVID vaccination when it comes out.  Lab work as outlined.   Former  smoker -     Ambulatory Referral for Lung Cancer Scre  Elevated LDL cholesterol level -     Comprehensive metabolic panel -     Lipid panel  Class 1 obesity due to excess calories with serious comorbidity and body mass index (BMI) of 33.0 to 33.9 in adult -     Hemoglobin A1c  History of prostate cancer -     PSA  Other orders -     amLODIPine Besylate; Take 1 tablet (5 mg total) by mouth daily.  Dispense: 90 tablet; Refill: 1 -     DULoxetine HCl; Take 1 capsule (60 mg total) by mouth daily.  Dispense: 90 capsule; Refill: 3 -     Meclizine HCl; TAKE 1/2 TABLET BY MOUTH 3 TIMES DAILY AS NEEDED  Dispense: 30 tablet; Refill: 0 -     Rosuvastatin Calcium; Take 1 tablet (20 mg total) by mouth daily.  Dispense: 90 tablet; Refill: 1    Return in about 6 months (around 02/20/2023).   Marikay Alar, MD [facility]

## 2022-08-21 LAB — HEMOGLOBIN A1C: Hgb A1c MFr Bld: 5.2 % (ref 4.6–6.5)

## 2022-08-21 LAB — COMPREHENSIVE METABOLIC PANEL
ALT: 26 U/L (ref 0–53)
AST: 24 U/L (ref 0–37)
Albumin: 4.2 g/dL (ref 3.5–5.2)
Alkaline Phosphatase: 77 U/L (ref 39–117)
BUN: 17 mg/dL (ref 6–23)
CO2: 29 meq/L (ref 19–32)
Calcium: 9.5 mg/dL (ref 8.4–10.5)
Chloride: 101 meq/L (ref 96–112)
Creatinine, Ser: 0.95 mg/dL (ref 0.40–1.50)
GFR: 83.69 mL/min (ref >60.00)
Glucose, Bld: 83 mg/dL (ref 70–99)
Potassium: 4.5 meq/L (ref 3.5–5.1)
Sodium: 136 meq/L (ref 135–145)
Total Bilirubin: 0.5 mg/dL (ref 0.2–1.2)
Total Protein: 6.5 g/dL (ref 6.0–8.3)

## 2022-08-21 LAB — PSA: PSA: 0.05 ng/mL — ABNORMAL LOW (ref 0.10–4.00)

## 2022-08-21 LAB — LIPID PANEL
Cholesterol: 114 mg/dL (ref 0–200)
HDL: 37.2 mg/dL — ABNORMAL LOW (ref >39.00)
LDL Cholesterol: 39 mg/dL (ref 0–99)
NonHDL: 76.92
Total CHOL/HDL Ratio: 3
Triglycerides: 189 mg/dL — ABNORMAL HIGH (ref 0.0–149.0)
VLDL: 37.8 mg/dL (ref 0.0–40.0)

## 2022-08-26 ENCOUNTER — Telehealth: Payer: Self-pay

## 2022-08-26 ENCOUNTER — Other Ambulatory Visit (HOSPITAL_COMMUNITY): Payer: Self-pay

## 2022-08-26 NOTE — Telephone Encounter (Signed)
Pharmacy Patient Advocate Encounter   Received notification from CoverMyMeds that prior authorization for Meclizine HCl 25MG  tablets is required/requested.   Insurance verification completed.   The patient is insured through [facility] .   Per test claim:  OTC ALTERNATIVE is preferred by the ins.  If suggested medication is appropriate, Please send in a new RX and discontinue this one. If not, please advise as to why it's not appropriate so that we may request a Prior Authorization.   Key: [id number]

## 2022-08-27 NOTE — Telephone Encounter (Signed)
Spoke to Patient and he is agreeable to use the OTC alternative for Meclizine.

## 2022-08-29 ENCOUNTER — Telehealth: Payer: Self-pay

## 2022-08-29 NOTE — Telephone Encounter (Signed)
-----   Message from Marikay Alar sent at 08/22/2022 12:00 PM EDT ----- Can we see if we can request prior PSA values from his previous urologist?  His PSA is 0.05.  I want to make sure that this is consistent with his prior values.  Can you also confirm with him what treatment he had for his prostate cancer?  His other labs are acceptable.

## 2022-08-29 NOTE — Telephone Encounter (Signed)
Left message to call back regarding the result message below.

## 2023-02-20 ENCOUNTER — Ambulatory Visit: Payer: BC Managed Care – PPO | Admitting: Nurse Practitioner

## 2023-02-20 ENCOUNTER — Ambulatory Visit: Payer: BC Managed Care – PPO | Admitting: Family Medicine

## 2023-02-20 ENCOUNTER — Encounter: Payer: Self-pay | Admitting: Nurse Practitioner

## 2023-02-20 VITALS — BP 124/81 | HR 82 | Temp 98.2°F | Ht 67.0 in | Wt 212.0 lb

## 2023-02-20 DIAGNOSIS — F320 Major depressive disorder, single episode, mild: Secondary | ICD-10-CM

## 2023-02-20 DIAGNOSIS — I10 Essential (primary) hypertension: Secondary | ICD-10-CM | POA: Diagnosis not present

## 2023-02-20 DIAGNOSIS — E669 Obesity, unspecified: Secondary | ICD-10-CM

## 2023-02-20 DIAGNOSIS — Z87891 Personal history of nicotine dependence: Secondary | ICD-10-CM | POA: Diagnosis not present

## 2023-02-20 DIAGNOSIS — E7800 Pure hypercholesterolemia, unspecified: Secondary | ICD-10-CM | POA: Diagnosis not present

## 2023-02-20 DIAGNOSIS — K219 Gastro-esophageal reflux disease without esophagitis: Secondary | ICD-10-CM

## 2023-02-20 MED ORDER — AMLODIPINE BESYLATE 5 MG PO TABS
5.0000 mg | ORAL_TABLET | Freq: Every day | ORAL | 3 refills | Status: AC
Start: 2023-02-20 — End: ?

## 2023-02-20 MED ORDER — ROSUVASTATIN CALCIUM 20 MG PO TABS
20.0000 mg | ORAL_TABLET | Freq: Every day | ORAL | 3 refills | Status: AC
Start: 2023-02-20 — End: ?

## 2023-02-20 NOTE — Assessment & Plan Note (Signed)
Stable on Cymbalta 60 mg daily, which helps maintain calmness. Continue Cymbalta.

## 2023-02-20 NOTE — Assessment & Plan Note (Signed)
With a history of 40 years of smoking and having quit 10-15 years ago, there are no current symptoms of cough, wheeze, or shortness of breath. Order lung cancer screening.

## 2023-02-20 NOTE — Progress Notes (Signed)
Brian Dicker, NP-C Phone: [phone]  Brian Luna is a 68 y.o. male who presents today for transfer of care.   Discussed the use of AI scribe software for clinical note transcription with the patient, who gave verbal consent to proceed.  History of Present Illness   Brian Luna is a 68 year old male who presents for a transfer of care visit.  He has a significant smoking history, having smoked for 40 years, but has been smoke-free for the past 15 years. A previous doctor suggested a lung screening, but he did not receive any follow-up regarding this. He would like to proceed with lung cancer screening CT.   No shortness of breath, regular cough, or chest pain. He mentions occasional wheezing but does not use any inhalers.  He is currently taking Norvasc (amlodipine) daily for blood pressure management and Crestor for cholesterol management. He does not monitor his blood pressure at home. No abdominal pain, muscle aches, or leg pains are reported.  He is on Cymbalta for mood stabilization, which helps keep him calm. He also takes Nexium for acid reflux and potassium supplements. He has received his second shingles vaccine.  He is trying to lose weight by cutting back on food intake and remains active through work. He is attempting to reduce sugar intake, particularly in beverages, and is considering healthier dietary options.      Social History   Tobacco Use  Smoking Status Former   Current packs/day: 0.00   Average packs/day: 1.5 packs/day for 40.0 years (60.0 ttl pk-yrs)   Types: Cigarettes   Start date: 06/19/1972   Quit date: 06/19/2012   Years since quitting: 10.6  Smokeless Tobacco Never    Current Outpatient Medications on File Prior to Visit  Medication Sig Dispense Refill   ascorbic acid (VITAMIN C) 500 MG tablet Take 500 mg by mouth daily.     aspirin 81 MG tablet Take 81 mg by mouth daily.     cyanocobalamin 2000 MCG tablet Take 2,000 mcg by mouth daily.      DULoxetine (CYMBALTA) 60 MG capsule Take 1 capsule (60 mg total) by mouth daily. 90 capsule 3   esomeprazole (NEXIUM) 20 MG capsule Take 20 mg by mouth daily.      Magnesium 500 MG TABS Take 500 mg by mouth daily.     meclizine (ANTIVERT) 25 MG tablet TAKE 1/2 TABLET BY MOUTH 3 TIMES DAILY AS NEEDED 30 tablet 0   naproxen sodium (ALEVE) 220 MG tablet Take 440 mg by mouth daily.     niacin 500 MG tablet Take 500 mg by mouth daily.      Potassium 99 MG TABS Take 99 mg by mouth daily.     No current facility-administered medications on file prior to visit.    ROS see history of present illness  Objective  Physical Exam Vitals:   02/20/23 1548  BP: 124/81  Pulse: 82  Temp: 98.2 F (36.8 C)  SpO2: 96%    BP Readings from Last 3 Encounters:  02/20/23 124/81  08/20/22 120/80  12/09/21 128/82   Wt Readings from Last 3 Encounters:  02/20/23 212 lb (96.2 kg)  08/20/22 212 lb (96.2 kg)  12/09/21 216 lb 9.6 oz (98.2 kg)    Physical Exam Constitutional:      General: He is not in acute distress.    Appearance: Normal appearance.  HENT:     Head: Normocephalic.  Cardiovascular:     Rate and Rhythm: Normal  rate and regular rhythm.     Heart sounds: Normal heart sounds.  Pulmonary:     Effort: Pulmonary effort is normal.     Breath sounds: Normal breath sounds.  Skin:    General: Skin is warm and dry.  Neurological:     General: No focal deficit present.     Mental Status: He is alert.  Psychiatric:        Mood and Affect: Mood normal.        Behavior: Behavior normal.     Assessment/Plan: Please see individual problem list.  Hypertension, unspecified type Assessment & Plan: Blood pressure is well controlled at 124/81 with Amlodipine 5 mg daily. Continue Amlodipine. Refills sent.   Orders: -     amLODIPine Besylate; Take 1 tablet (5 mg total) by mouth daily.  Dispense: 90 tablet; Refill: 3  Elevated LDL cholesterol level Assessment & Plan: Currently well  controlled on Crestor 20 mg daily with no reported side effects. Continue Crestor. Refills sent.   Orders: -     Rosuvastatin Calcium; Take 1 tablet (20 mg total) by mouth daily.  Dispense: 90 tablet; Refill: 3  Depression, major, single episode, mild (HCC) Assessment & Plan: Stable on Cymbalta 60 mg daily, which helps maintain calmness. Continue Cymbalta.   Former smoker Assessment & Plan: With a history of 40 years of smoking and having quit 10-15 years ago, there are no current symptoms of cough, wheeze, or shortness of breath. Order lung cancer screening.  Orders: -     Ambulatory Referral for Lung Cancer Scre  Gastroesophageal reflux disease, unspecified whether esophagitis present Assessment & Plan: On Nexium 20 mg daily with no reported abdominal pain or difficulty swallowing. Symptoms well managed. Continue Nexium.   Obesity (BMI 30-39.9) Assessment & Plan: Interested in losing weight. Discussed diet modifications including reducing salt and carbohydrates, increasing protein, and switching to sugar-free drinks. Encourage continued diet modifications for weight loss and remaining active.      Return in about 6 months (around 08/20/2023) for Annual Exam, sooner as needed.   Brian Dicker, NP-C [facility] - [facility]

## 2023-02-20 NOTE — Assessment & Plan Note (Signed)
On Nexium 20 mg daily with no reported abdominal pain or difficulty swallowing. Symptoms well managed. Continue Nexium.

## 2023-02-20 NOTE — Assessment & Plan Note (Signed)
Interested in losing weight. Discussed diet modifications including reducing salt and carbohydrates, increasing protein, and switching to sugar-free drinks. Encourage continued diet modifications for weight loss and remaining active.

## 2023-02-20 NOTE — Assessment & Plan Note (Signed)
Currently well controlled on Crestor 20 mg daily with no reported side effects. Continue Crestor. Refills sent.

## 2023-02-20 NOTE — Assessment & Plan Note (Signed)
Blood pressure is well controlled at 124/81 with Amlodipine 5 mg daily. Continue Amlodipine. Refills sent.

## 2023-02-23 ENCOUNTER — Encounter: Payer: Self-pay | Admitting: Emergency Medicine

## 2023-09-11 ENCOUNTER — Encounter: Payer: Self-pay | Admitting: Nurse Practitioner

## 2023-09-11 ENCOUNTER — Ambulatory Visit: Payer: BC Managed Care – PPO | Admitting: Nurse Practitioner

## 2023-09-11 VITALS — BP 116/72 | HR 78 | Temp 97.8°F | Ht 64.5 in | Wt 209.4 lb

## 2023-09-11 DIAGNOSIS — I10 Essential (primary) hypertension: Secondary | ICD-10-CM

## 2023-09-11 DIAGNOSIS — E669 Obesity, unspecified: Secondary | ICD-10-CM

## 2023-09-11 DIAGNOSIS — Z8546 Personal history of malignant neoplasm of prostate: Secondary | ICD-10-CM | POA: Diagnosis not present

## 2023-09-11 DIAGNOSIS — Z0000 Encounter for general adult medical examination without abnormal findings: Secondary | ICD-10-CM

## 2023-09-11 DIAGNOSIS — F320 Major depressive disorder, single episode, mild: Secondary | ICD-10-CM

## 2023-09-11 DIAGNOSIS — E7800 Pure hypercholesterolemia, unspecified: Secondary | ICD-10-CM | POA: Diagnosis not present

## 2023-09-11 DIAGNOSIS — R42 Dizziness and giddiness: Secondary | ICD-10-CM

## 2023-09-11 MED ORDER — MECLIZINE HCL 25 MG PO TABS
ORAL_TABLET | ORAL | 0 refills | Status: AC
Start: 2023-09-11 — End: ?

## 2023-09-11 MED ORDER — DULOXETINE HCL 60 MG PO CPEP
60.0000 mg | ORAL_CAPSULE | Freq: Every day | ORAL | 3 refills | Status: AC
Start: 2023-09-11 — End: ?

## 2023-09-11 NOTE — Progress Notes (Signed)
 Leron Glance, NP-C Phone: [phone]  Brian Luna is a 68 y.o. male who presents today for annual exam.   Discussed the use of AI scribe software for clinical note transcription with the patient, who gave verbal consent to proceed.  History of Present Illness   Brian Luna is a 68 year old male who presents for an annual physical exam.  He has a history of prostate cancer, which was detected after an exam. He reports that his prostate-specific antigen levels have always been one and under during the time he had cancer. He underwent high intensity frequency ultrasound treatment in the [place].  No new problems or concerns. No chest pain, shortness of breath, abdominal pain, constipation, diarrhea, dysuria, headaches, dizziness, dysphagia, skin changes, or rashes. He experiences knee swelling with prolonged standing and some hip pain.  He sleeps well but wakes up every four hours to urinate. No mood issues, anxiety, or depression.  He does not smoke and drinks alcohol occasionally, about once a week. 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 He eats a varied diet, including vegetables and fish, and his wife cooks at home.      Social History   Tobacco Use  Smoking Status Former   Current packs/day: 0.00   Average packs/day: 1.5 packs/day for 40.0 years (60.0 ttl pk-yrs)   Types: Cigarettes   Start date: 06/19/1972   Quit date: 06/19/2012   Years since quitting: 11.2  Smokeless Tobacco Never    Current Outpatient Medications on File Prior to Visit  Medication Sig Dispense Refill   amLODipine  (NORVASC ) 5 MG tablet Take 1 tablet (5 mg total) by mouth daily. 90 tablet 3   ascorbic acid (VITAMIN C) 500 MG tablet Take 500 mg by mouth daily.     aspirin 81 MG tablet Take 81 mg by mouth daily.     cyanocobalamin 2000 MCG tablet Take 2,000 mcg by mouth daily.     esomeprazole (NEXIUM) 20 MG capsule Take 20 mg by mouth daily.      Magnesium 500 MG TABS Take  500 mg by mouth daily.     naproxen sodium (ALEVE) 220 MG tablet Take 440 mg by mouth daily.     niacin 500 MG tablet Take 500 mg by mouth daily.      Potassium 99 MG TABS Take 99 mg by mouth daily.     rosuvastatin  (CRESTOR ) 20 MG tablet Take 1 tablet (20 mg total) by mouth daily. 90 tablet 3   No current facility-administered medications on file prior to visit.     ROS see history of present illness  Objective  Physical Exam Vitals:   09/11/23 1517  BP: 116/72  Pulse: 78  Temp: 97.8 F (36.6 C)  SpO2: 98%    BP Readings from Last 3 Encounters:  09/11/23 116/72  02/20/23 124/81  08/20/22 120/80   Wt Readings from Last 3 Encounters:  09/11/23 209 lb 6 oz (95 kg)  02/20/23 212 lb (96.2 kg)  08/20/22 212 lb (96.2 kg)    Physical Exam Constitutional:      General: He is not in acute distress.    Appearance: Normal appearance.  HENT:     Head: Normocephalic.     Right Ear: Tympanic membrane normal.     Left Ear: Tympanic membrane normal.     Nose: Nose normal.     Mouth/Throat:     Mouth: Mucous membranes are moist.  Pharynx: Oropharynx is clear.  Eyes:     Conjunctiva/sclera: Conjunctivae normal.     Pupils: Pupils are equal, round, and reactive to light.  Neck:     Thyroid : No thyromegaly.  Cardiovascular:     Rate and Rhythm: Normal rate and regular rhythm.     Heart sounds: Normal heart sounds.  Pulmonary:     Effort: Pulmonary effort is normal.     Breath sounds: Normal breath sounds.  Abdominal:     General: Abdomen is flat. Bowel sounds are normal.     Palpations: Abdomen is soft. There is no mass.     Tenderness: There is no abdominal tenderness.  Musculoskeletal:        General: Normal range of motion.  Lymphadenopathy:     Cervical: No cervical adenopathy.  Skin:    General: Skin is warm and dry.     Findings: No rash.  Neurological:     General: No focal deficit present.     Mental Status: He is alert.  Psychiatric:        Mood and  Affect: Mood normal.        Behavior: Behavior normal.      Assessment/Plan: Please see individual problem list.  Routine general medical examination at a health care facility Assessment & Plan: Routine wellness visit with no new concerns. Physical exam complete. Lab work as outlined. PSA screening today in lab work. Colonoscopy is up to date. Tetanus vaccine is up to date. He has received 7 COVID vaccines and plans to get his flu vaccine later this fall. Shingles and pneumonia vaccine series completed. He maintains a healthy lifestyle with occasional alcohol use, no smoking or drugs, and remains active due to work. Continue routine dental and eye exams. Return to care in 6 months, sooner as needed.    Hypertension, unspecified type Assessment & Plan: Blood pressure is well controlled at 116/72 with Amlodipine  5 mg daily. Continue Amlodipine . Check lab work as outlined.   Orders: -     Comprehensive metabolic panel with GFR -     CBC with Differential/Platelet  Elevated LDL cholesterol level Assessment & Plan: Managed with Crestor  20 mg daily with no reported side effects. Continue Crestor . Check lipid panel.    Orders: -     Lipid panel  History of prostate cancer Assessment & Plan: Prostate cancer was treated with high intensity frequency ultrasound. PSA levels are stable and low post-treatment with no symptoms or concerns. Check PSA level today.  Orders: -     PSA  Depression, major, single episode, mild Assessment & Plan: Depression is stable on Cymbalta  60 mg daily, which helps maintain calmness. Continue Cymbalta .  Orders: -     DULoxetine  HCl; Take 1 capsule (60 mg total) by mouth daily.  Dispense: 90 capsule; Refill: 3 -     TSH  Obesity (BMI 30-39.9) -     Hemoglobin A1c  Vertigo -     Meclizine  HCl; TAKE 1/2 TABLET BY MOUTH 3 TIMES DAILY AS NEEDED  Dispense: 30 tablet; Refill: 0      Return in about 6 months (around 03/10/2024) for Follow up.   Leron Glance, NP-C [facility] - [facility]

## 2023-09-12 LAB — COMPREHENSIVE METABOLIC PANEL WITH GFR
ALT: 29 IU/L (ref 0–44)
AST: 29 IU/L (ref 0–40)
Albumin: 4.4 g/dL (ref 3.9–4.9)
Alkaline Phosphatase: 91 IU/L (ref 44–121)
BUN/Creatinine Ratio: 17 (ref 10–24)
BUN: 19 mg/dL (ref 8–27)
Bilirubin Total: 0.5 mg/dL (ref 0.0–1.2)
CO2: 22 mmol/L (ref 20–29)
Calcium: 9.3 mg/dL (ref 8.6–10.2)
Chloride: 105 mmol/L (ref 96–106)
Creatinine, Ser: 1.12 mg/dL (ref 0.76–1.27)
Globulin, Total: 2.3 g/dL (ref 1.5–4.5)
Glucose: 80 mg/dL (ref 70–99)
Potassium: 4.6 mmol/L (ref 3.5–5.2)
Sodium: 141 mmol/L (ref 134–144)
Total Protein: 6.7 g/dL (ref 6.0–8.5)
eGFR: 72 mL/min/1.73 (ref >59)

## 2023-09-12 LAB — HEMOGLOBIN A1C
Est. average glucose Bld gHb Est-mCnc: 100 mg/dL
Hgb A1c MFr Bld: 5.1 % (ref 4.8–5.6)

## 2023-09-12 LAB — CBC WITH DIFFERENTIAL/PLATELET
Basophils Absolute: 0.1 x10E3/uL (ref 0.0–0.2)
Basos: 1 %
EOS (ABSOLUTE): 0.5 x10E3/uL — ABNORMAL HIGH (ref 0.0–0.4)
Eos: 6 %
Hematocrit: 42.4 % (ref 37.5–51.0)
Hemoglobin: 14.4 g/dL (ref 13.0–17.7)
Immature Grans (Abs): 0 x10E3/uL (ref 0.0–0.1)
Immature Granulocytes: 0 %
Lymphocytes Absolute: 2.6 x10E3/uL (ref 0.7–3.1)
Lymphs: 29 %
MCH: 31.8 pg (ref 26.6–33.0)
MCHC: 34 g/dL (ref 31.5–35.7)
MCV: 94 fL (ref 79–97)
Monocytes Absolute: 1 x10E3/uL — ABNORMAL HIGH (ref 0.1–0.9)
Monocytes: 11 %
Neutrophils Absolute: 4.7 x10E3/uL (ref 1.4–7.0)
Neutrophils: 53 %
Platelets: 217 x10E3/uL (ref 150–450)
RBC: 4.53 x10E6/uL (ref 4.14–5.80)
RDW: 13.3 % (ref 11.6–15.4)
WBC: 8.9 x10E3/uL (ref 3.4–10.8)

## 2023-09-12 LAB — LIPID PANEL
Chol/HDL Ratio: 3 ratio (ref 0.0–5.0)
Cholesterol, Total: 115 mg/dL (ref 100–199)
HDL: 38 mg/dL — ABNORMAL LOW (ref >39)
LDL Chol Calc (NIH): 53 mg/dL (ref 0–99)
Triglycerides: 135 mg/dL (ref 0–149)
VLDL Cholesterol Cal: 24 mg/dL (ref 5–40)

## 2023-09-12 LAB — TSH: TSH: 1.22 u[IU]/mL (ref 0.450–4.500)

## 2023-09-12 LAB — PSA: Prostate Specific Ag, Serum: <0.1 ng/mL (ref 0.0–4.0)

## 2023-09-16 ENCOUNTER — Ambulatory Visit: Payer: Self-pay | Admitting: Nurse Practitioner

## 2023-09-30 ENCOUNTER — Encounter: Payer: Self-pay | Admitting: Nurse Practitioner

## 2023-09-30 NOTE — Assessment & Plan Note (Signed)
 Blood pressure is well controlled at 116/72 with Amlodipine  5 mg daily. Continue Amlodipine . Check lab work as outlined.

## 2023-09-30 NOTE — Assessment & Plan Note (Signed)
 Prostate cancer was treated with high intensity frequency ultrasound. PSA levels are stable and low post-treatment with no symptoms or concerns. Check PSA level today.

## 2023-09-30 NOTE — Assessment & Plan Note (Signed)
 Managed with Crestor  20 mg daily with no reported side effects. Continue Crestor . Check lipid panel.

## 2023-09-30 NOTE — Assessment & Plan Note (Addendum)
 Routine wellness visit with no new concerns. Physical exam complete. Lab work as outlined. PSA screening today in lab work. Colonoscopy is up to date. Tetanus vaccine is up to date. He has received 7 COVID vaccines and plans to get his flu vaccine later this fall. Shingles and pneumonia vaccine series completed. He maintains a healthy lifestyle with occasional alcohol use, no smoking or drugs, and remains active due to work. Continue routine dental and eye exams. Return to care in 6 months, sooner as needed.

## 2023-09-30 NOTE — Assessment & Plan Note (Signed)
 Depression is stable on Cymbalta  60 mg daily, which helps maintain calmness. Continue Cymbalta .

## 2024-03-11 ENCOUNTER — Ambulatory Visit: Admitting: Nurse Practitioner
